# Patient Record
Sex: Male | Born: 1935 | Race: White | Hispanic: No | Smoking: Never smoker
Health system: Southern US, Community
[De-identification: ages and names within clinical notes are randomized; demographics above are authoritative.]

## PROBLEM LIST (undated history)

## (undated) DIAGNOSIS — I82409 Acute embolism and thrombosis of unspecified deep veins of unspecified lower extremity: Secondary | ICD-10-CM

## (undated) DIAGNOSIS — J342 Deviated nasal septum: Secondary | ICD-10-CM

## (undated) DIAGNOSIS — J9691 Respiratory failure, unspecified with hypoxia: Secondary | ICD-10-CM

## (undated) DIAGNOSIS — J181 Lobar pneumonia, unspecified organism: Secondary | ICD-10-CM

## (undated) DIAGNOSIS — I2699 Other pulmonary embolism without acute cor pulmonale: Secondary | ICD-10-CM

## (undated) DIAGNOSIS — N400 Enlarged prostate without lower urinary tract symptoms: Secondary | ICD-10-CM

## (undated) DIAGNOSIS — J189 Pneumonia, unspecified organism: Secondary | ICD-10-CM

## (undated) HISTORY — DX: Benign prostatic hyperplasia without lower urinary tract symptoms: N40.0

## (undated) HISTORY — DX: Respiratory failure, unspecified with hypoxia: J96.91

## (undated) HISTORY — DX: Pneumonia, unspecified organism: J18.9

## (undated) HISTORY — DX: Lobar pneumonia, unspecified organism: J18.1

## (undated) HISTORY — DX: Other pulmonary embolism without acute cor pulmonale: I26.99

## (undated) HISTORY — PX: EYE SURGERY: SHX253

---

## 2012-08-09 ENCOUNTER — Ambulatory Visit (INDEPENDENT_AMBULATORY_CARE_PROVIDER_SITE_OTHER): Payer: Self-pay | Admitting: Otolaryngology

## 2012-08-09 ENCOUNTER — Ambulatory Visit (INDEPENDENT_AMBULATORY_CARE_PROVIDER_SITE_OTHER): Payer: Medicare Other | Admitting: Otolaryngology

## 2012-08-09 DIAGNOSIS — H9209 Otalgia, unspecified ear: Secondary | ICD-10-CM

## 2012-08-09 DIAGNOSIS — J343 Hypertrophy of nasal turbinates: Secondary | ICD-10-CM

## 2012-08-09 DIAGNOSIS — J342 Deviated nasal septum: Secondary | ICD-10-CM

## 2014-05-30 HISTORY — PX: OTHER SURGICAL HISTORY: SHX169

## 2014-07-01 ENCOUNTER — Encounter: Payer: Self-pay | Admitting: Critical Care Medicine

## 2014-07-02 ENCOUNTER — Encounter: Payer: Self-pay | Admitting: Critical Care Medicine

## 2014-07-02 ENCOUNTER — Ambulatory Visit (INDEPENDENT_AMBULATORY_CARE_PROVIDER_SITE_OTHER): Payer: Medicare Other | Admitting: Critical Care Medicine

## 2014-07-02 ENCOUNTER — Ambulatory Visit (INDEPENDENT_AMBULATORY_CARE_PROVIDER_SITE_OTHER)
Admission: RE | Admit: 2014-07-02 | Discharge: 2014-07-02 | Disposition: A | Discharge status: Home / Self Care | Payer: Medicare Other | Source: Ambulatory Visit | Attending: Critical Care Medicine | Admitting: Critical Care Medicine

## 2014-07-02 ENCOUNTER — Other Ambulatory Visit (INDEPENDENT_AMBULATORY_CARE_PROVIDER_SITE_OTHER): Payer: Medicare Other

## 2014-07-02 VITALS — BP 116/58 | HR 115 | Temp 98.5°F | Ht 71.0 in | Wt 184.0 lb

## 2014-07-02 DIAGNOSIS — I82403 Acute embolism and thrombosis of unspecified deep veins of lower extremity, bilateral: Secondary | ICD-10-CM

## 2014-07-02 DIAGNOSIS — I82409 Acute embolism and thrombosis of unspecified deep veins of unspecified lower extremity: Secondary | ICD-10-CM | POA: Insufficient documentation

## 2014-07-02 DIAGNOSIS — J9691 Respiratory failure, unspecified with hypoxia: Secondary | ICD-10-CM | POA: Insufficient documentation

## 2014-07-02 DIAGNOSIS — Z86711 Personal history of pulmonary embolism: Secondary | ICD-10-CM | POA: Insufficient documentation

## 2014-07-02 DIAGNOSIS — J189 Pneumonia, unspecified organism: Secondary | ICD-10-CM

## 2014-07-02 DIAGNOSIS — R3914 Feeling of incomplete bladder emptying: Secondary | ICD-10-CM

## 2014-07-02 DIAGNOSIS — N400 Enlarged prostate without lower urinary tract symptoms: Secondary | ICD-10-CM

## 2014-07-02 DIAGNOSIS — Z8719 Personal history of other diseases of the digestive system: Secondary | ICD-10-CM | POA: Insufficient documentation

## 2014-07-02 DIAGNOSIS — I2699 Other pulmonary embolism without acute cor pulmonale: Secondary | ICD-10-CM

## 2014-07-02 DIAGNOSIS — Z9889 Other specified postprocedural states: Secondary | ICD-10-CM

## 2014-07-02 DIAGNOSIS — J9611 Chronic respiratory failure with hypoxia: Secondary | ICD-10-CM

## 2014-07-02 DIAGNOSIS — N401 Enlarged prostate with lower urinary tract symptoms: Secondary | ICD-10-CM

## 2014-07-02 DIAGNOSIS — J181 Lobar pneumonia, unspecified organism: Secondary | ICD-10-CM

## 2014-07-02 LAB — CBC WITH DIFFERENTIAL/PLATELET
Basophils Absolute: 0 10*3/uL (ref 0.0–0.1)
Basophils Relative: 0.6 % (ref 0.0–3.0)
EOS PCT: 2.2 % (ref 0.0–5.0)
Eosinophils Absolute: 0.2 10*3/uL (ref 0.0–0.7)
HCT: 45.2 % (ref 39.0–52.0)
Hemoglobin: 15.3 g/dL (ref 13.0–17.0)
LYMPHS ABS: 0.9 10*3/uL (ref 0.7–4.0)
Lymphocytes Relative: 11.9 % — ABNORMAL LOW (ref 12.0–46.0)
MCHC: 33.9 g/dL (ref 30.0–36.0)
MCV: 94.6 fl (ref 78.0–100.0)
MONOS PCT: 8 % (ref 3.0–12.0)
Monocytes Absolute: 0.6 10*3/uL (ref 0.1–1.0)
NEUTROS ABS: 5.8 10*3/uL (ref 1.4–7.7)
Neutrophils Relative %: 77.3 % — ABNORMAL HIGH (ref 43.0–77.0)
Platelets: 378 10*3/uL (ref 150.0–400.0)
RBC: 4.78 Mil/uL (ref 4.22–5.81)
RDW: 13.4 % (ref 11.5–15.5)
WBC: 7.6 10*3/uL (ref 4.0–10.5)

## 2014-07-02 LAB — COMPREHENSIVE METABOLIC PANEL
ALBUMIN: 3.7 g/dL (ref 3.5–5.2)
ALK PHOS: 108 U/L (ref 39–117)
ALT: 20 U/L (ref 0–53)
AST: 15 U/L (ref 0–37)
BUN: 15 mg/dL (ref 6–23)
CALCIUM: 9.8 mg/dL (ref 8.4–10.5)
CHLORIDE: 102 meq/L (ref 96–112)
CO2: 27 meq/L (ref 19–32)
Creatinine, Ser: 0.85 mg/dL (ref 0.40–1.50)
GFR: 92.81 mL/min (ref >60.00)
GLUCOSE: 91 mg/dL (ref 70–99)
POTASSIUM: 4.6 meq/L (ref 3.5–5.1)
SODIUM: 136 meq/L (ref 135–145)
TOTAL PROTEIN: 7.2 g/dL (ref 6.0–8.3)
Total Bilirubin: 0.5 mg/dL (ref 0.2–1.2)

## 2014-07-02 IMAGING — CR DG CHEST 2V
2 series · 2 of 2 positions shown · non-contrast
Comparison: 06/10/2014.

CLINICAL DATA: Pneumonia.

EXAM:
CHEST  2 VIEW

[view not recorded (1 of 2)]
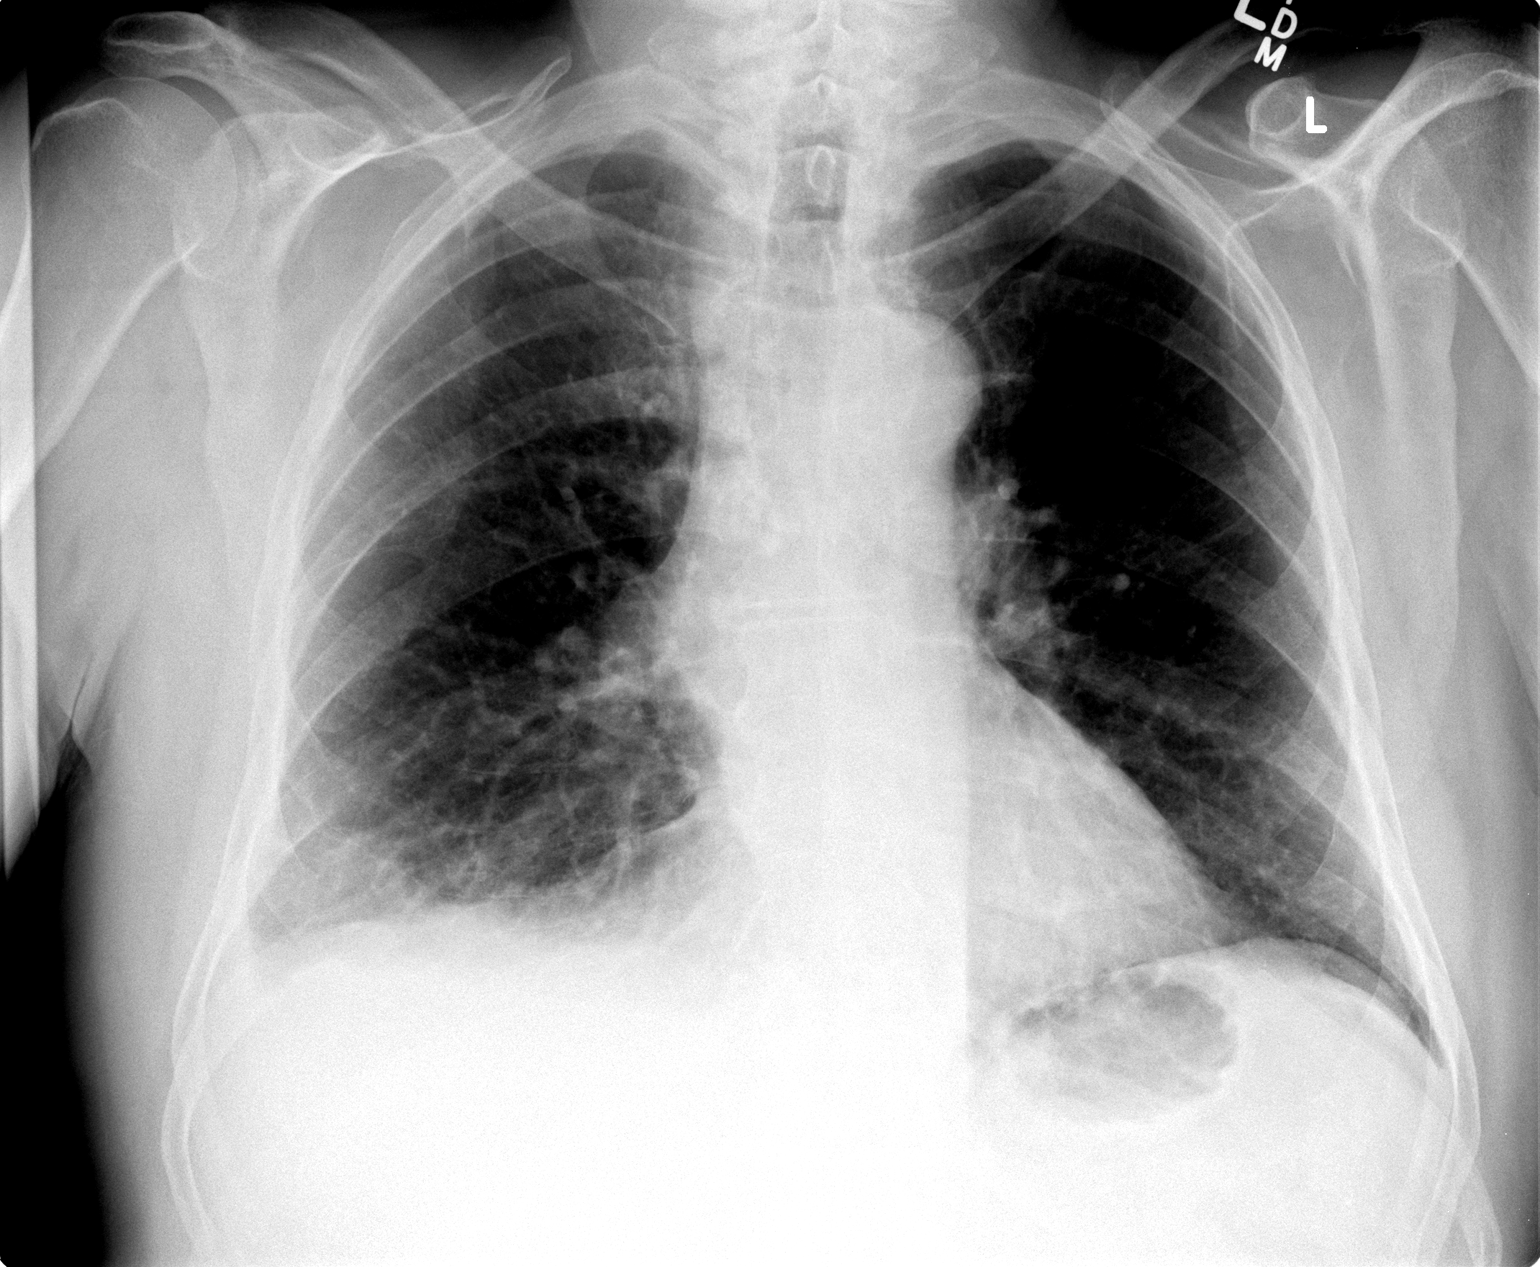

[view not recorded (2 of 2)]
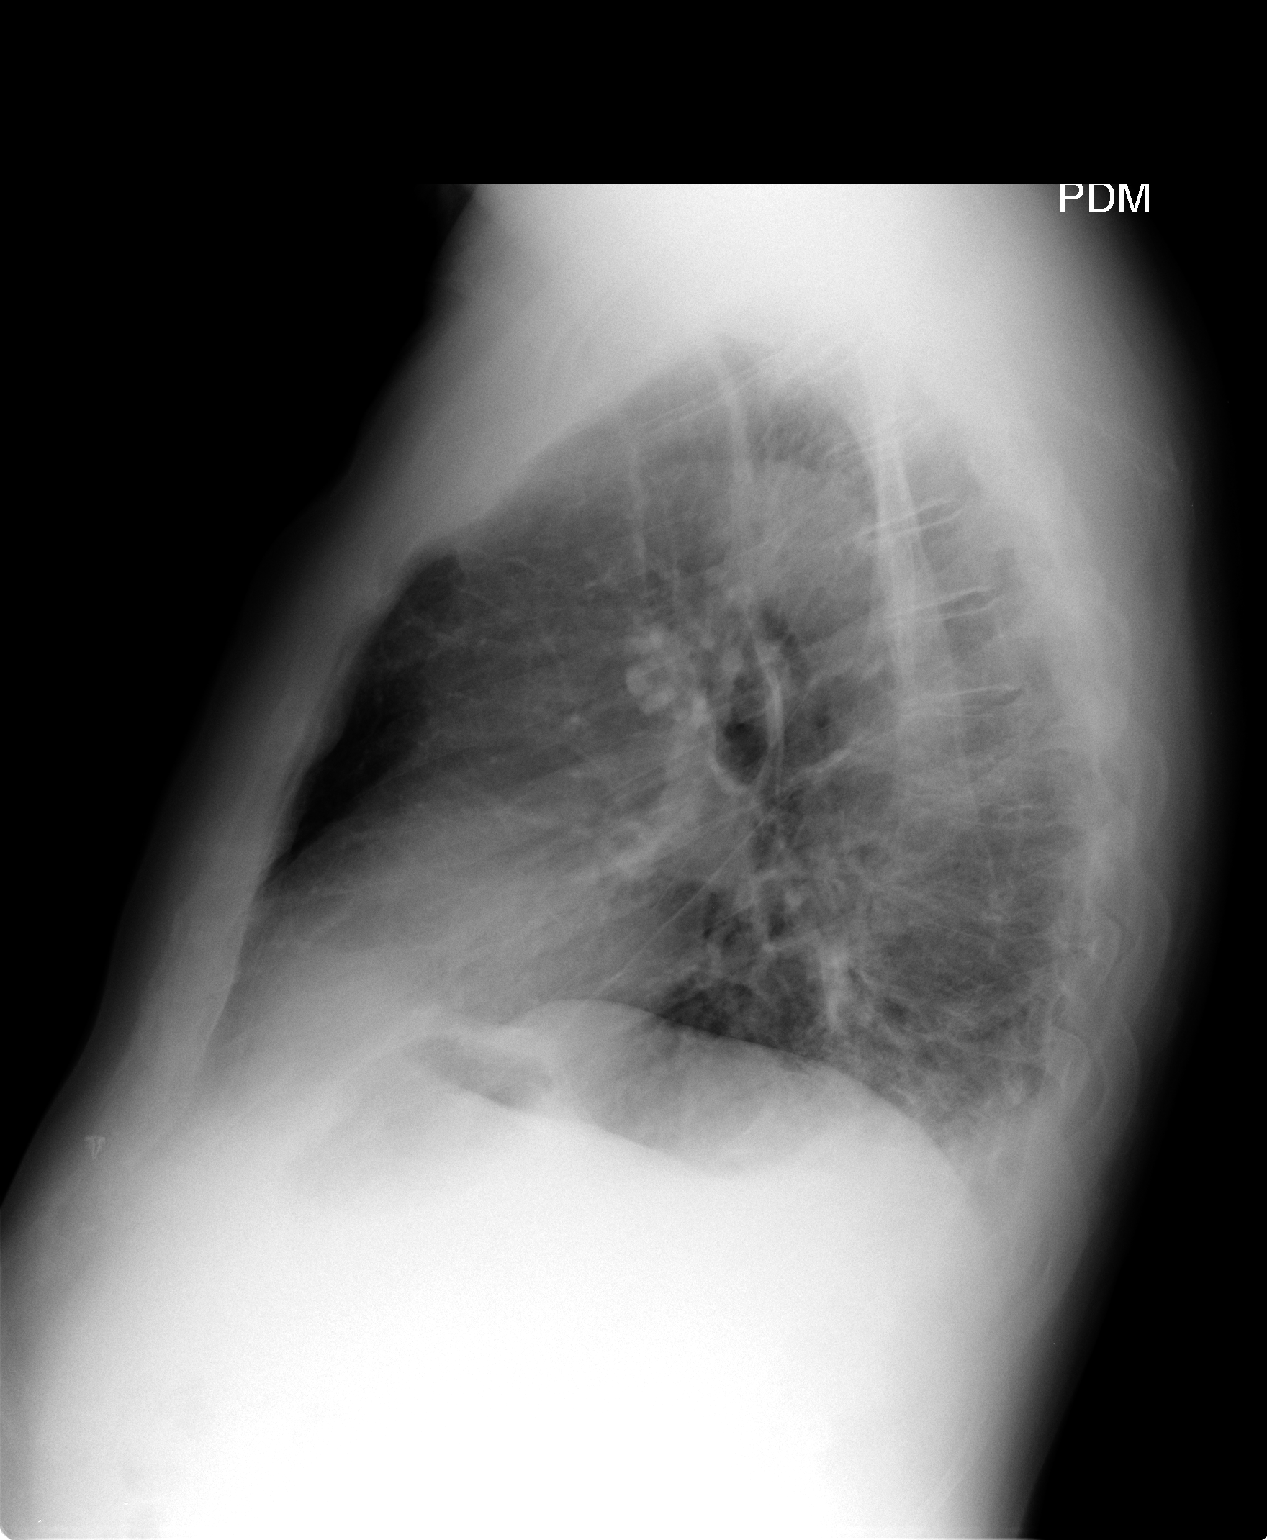

[2 of 2 positions shown; findings below may reference images not displayed]

FINDINGS: Mediastinum and hilar structures normal. Heart size normal. Right
lower lobe infiltrate consistent with pneumonia. Small right pleural
effusion. No acute bony abnormality.
IMPRESSION: Right lower lobe mild infiltrate consistent with pneumonia.
Associated small right pleural effusion.

## 2014-07-02 NOTE — Progress Notes (Signed)
Subjective:    Patient ID: Richard Cannon, male    DOB: 28-Feb-1935, 80 y.o.   MRN: [id number]  HPI Comments: Hx of PE/VTE/DVT.  Pt had hernia surgery 05/30/14.   No prior hx of blood clots. Pt had episode of UTI/BPH after hernia surgery. [facility].  WHile in hosp had injections, none on d/c.  After d/c to home, issues with urination, then back to ED, placed a catheter and sent home. During this time had lots of mucus, emesis, dry heaves, poss aspiration. 1/16 back to ED dx PNA, resp failure, and PE DVT. Rx eliquis and Lovenox .  Now needs BPH surgery.  Pt has urology MD in [place].  Pt desires to transfer to [facility] for medical care . D/c 06/15/14: since d/c is better, started oxygen 2L 24/7: DME [facility].  Now no cough. No hematuria. Off lovenox now.  Now on eliquis  bid after 7 days bid   Shortness of Breath This is a new problem. The current episode started 1 to 4 weeks ago. The problem occurs daily. The problem has been rapidly improving. Pertinent negatives include no abdominal pain, chest pain, ear pain, fever, headaches, hemoptysis, leg pain, leg swelling, neck pain, orthopnea, PND, rash, rhinorrhea, sore throat, sputum production, swollen glands, syncope, vomiting or wheezing. Nothing aggravates the symptoms. His past medical history is significant for DVT, PE, pneumonia and a recent surgery. There is no history of allergies, asthma, bronchiolitis, CAD, chronic lung disease, COPD or a heart failure.   Past Medical History  Diagnosis Date  . Acute pulmonary embolism   . Pneumonia of lower lobe of lung     bilateral  . Respiratory failure with hypoxia   . Benign prostatic hypertrophy      Family History  Problem Relation Age of Onset  . Heart disease Mother   . Breast cancer Mother   . Skin cancer Brother      History   Social History  . Marital Status: Married    Spouse Name: N/A  . Number of Children: N/A  . Years of Education: N/A   Occupational History  . Retired    Financial controller   Social History Main Topics  . Smoking status: Never Smoker   . Smokeless tobacco: Never Used  . Alcohol Use: No  . Drug Use: No  . Sexual Activity: Not on file   Other Topics Concern  . Not on file   Social History Narrative     Allergies  Allergen Reactions  . Iodine   . Tetracyclines & Related     Hospitalized after taking - unsure of reaction     No outpatient prescriptions prior to visit.   No facility-administered medications prior to visit.       Review of Systems  Constitutional: Positive for chills, fatigue and unexpected weight change. Negative for fever, diaphoresis, activity change and appetite change.  HENT: Positive for voice change. Negative for congestion, dental problem, ear discharge, ear pain, facial swelling, hearing loss, mouth sores, nosebleeds, postnasal drip, rhinorrhea, sinus pressure, sneezing, sore throat, tinnitus and trouble swallowing.   Eyes: Negative for photophobia, discharge, itching and visual disturbance.  Respiratory: Positive for cough and shortness of breath. Negative for apnea, hemoptysis, sputum production, choking, chest tightness, wheezing and stridor.   Cardiovascular: Negative for chest pain, palpitations, orthopnea, leg swelling, syncope and PND.  Gastrointestinal: Positive for constipation. Negative for nausea, vomiting, abdominal pain, blood in stool and abdominal distention.       Occ gerd  Genitourinary: Negative for dysuria, urgency, frequency, hematuria, flank pain, decreased urine volume and difficulty urinating.  Musculoskeletal: Positive for gait problem. Negative for myalgias, back pain, joint swelling, arthralgias, neck pain and neck stiffness.  Skin: Negative for color change, pallor and rash.  Neurological: Positive for dizziness and weakness. Negative for tremors, seizures, syncope, speech difficulty, light-headedness, numbness and headaches.  Hematological: Negative for adenopathy. Does not  bruise/bleed easily.  Psychiatric/Behavioral: Negative for confusion, sleep disturbance and agitation. The patient is not nervous/anxious.        Objective:   Physical Exam Filed Vitals:   07/02/14 0853  BP: 116/58  Pulse: 115  Temp: 98.5 F (36.9 C)  TempSrc: Oral  Height: 5\' 11"  (1.803 m)  Weight: 184 lb (83.462 kg)  SpO2: 97%    Gen: Pleasant, well-nourished, in no distress,  normal affect  ENT: No lesions,  mouth clear,  oropharynx clear, no postnasal drip  Neck: No JVD, no TMG, no carotid bruits  Lungs: No use of accessory muscles, no dullness to percussion, clear without rales or rhonchi  Cardiovascular: RRR, heart sounds normal, no murmur or gallops, no peripheral edema  Abdomen: soft and NT, no HSM,  BS normal  Musculoskeletal: No deformities, no cyanosis or clubbing  Neuro: alert, non focal  Skin: Warm, no lesions or rashes  No results found.  [facility] Echocardiogram was normal       Assessment & Plan:   Acute pulmonary embolism History of massive saddle pulmonary embolism with bilateral deep venous thrombosis and significant clot burden January 2016 Associated aspiration pneumonia with acute hypoxic respiratory failure No hypercoagulable studies have yet been obtained Suspicion is this is a provoked event in a male with previous right inguinal hernia surgery and likely insufficient perioperative thrombo-prophylaxis Plan Would suggest minimum 3 months of therapy with Eliquis to continue at 5 mg twice daily. Note patient had already completed a week's worth of Eliquis 10 mg twice daily. 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 and chronic indwelling Foley catheter placement from January 2016 The patient desires a referral to [facility] as he is wishing to [place] due to local family being in this area Plan [facility] Dr Crecencio McLes Borden  for evaluation of prosthetic hypertrophy Note this patient should remain on Eliquis for at least 2 more weeks before any plans to consider surgery on the prostate is entertained and the patient will need bridging with Lovenox should surgical intervention be entertained   Pneumonia of lower lobe of lung Bilateral aspiration pneumonia which has now resolved Plan Obtain chest x-ray   DVT (deep venous thrombosis) History of bilateral deep venous thrombosis now on anticoagulant therapy with improvement in symptom complex Note this was provoked    Note this patient does not have a diagnosis of hypertension and is on at least 2 different antihypertensive medications. Likely this patient was quite hypertensive due to significant pulmonary emboli. Also because of urinary tract obstruction. Note patient's edema has improved and now the patient is volume depleted.  Plan Discontinue potassium, lisinopril, albuterol, Norvasc.  I had an extended discussion with the patient  reviewing all relevant studies completed to date and  lasting 15 to 20 minutes of a 25 minute visit on the following ongoing concerns:     Discussion included coordination of care, disease process, medication management, and change in plan  Updated Medication List Outpatient Encounter Prescriptions as of 07/02/2014  Medication Sig  . acetaminophen (TYLENOL) 325 MG tablet Take 325-650 mg by mouth every 4 (four) hours as needed.  . ciprofloxacin (CIPRO) 500 MG tablet Take 1 tablet by mouth 2 (two) times daily. Will take last dose on 07/02/14  . ELIQUIS 5 MG TABS tablet Take 1 tablet by mouth 2  (two) times daily.  . finasteride (PROSCAR) 5 MG tablet Take 1 tablet by mouth daily.  Marland Kitchen HYDROcodone-acetaminophen (NORCO) 7.5-325 MG per tablet as needed.  . nystatin (MYCOSTATIN) 100000 UNIT/ML suspension as needed.  . tamsulosin (FLOMAX) 0.4 MG CAPS capsule Take 0.4 mg by mouth 2 (two) times daily.  . [DISCONTINUED] albuterol (PROVENTIL) (2.5 MG/3ML) 0.083% nebulizer solution Take 2.5 mg by nebulization 2 (two) times daily.  . [DISCONTINUED] amLODipine (NORVASC) 10 MG tablet Take 10 mg by mouth daily.  . [DISCONTINUED] KLOR-CON M20 20 MEQ tablet Take 20 mEq by mouth daily.  . [DISCONTINUED] lisinopril (PRINIVIL,ZESTRIL) 10 MG tablet Take 10 mg by mouth daily.

## 2014-07-02 NOTE — Assessment & Plan Note (Signed)
Bilateral aspiration pneumonia which has now resolved Plan Obtain chest x-ray

## 2014-07-02 NOTE — Assessment & Plan Note (Signed)
History of massive saddle pulmonary embolism with bilateral deep venous thrombosis and significant clot burden January 2016 Associated aspiration pneumonia with acute hypoxic respiratory failure No hypercoagulable studies have yet been obtained Suspicion is this is a provoked event in a male with previous right inguinal hernia surgery and likely insufficient perioperative thrombo-prophylaxis Plan Would suggest minimum 3 months of therapy with Eliquis to continue at 5 mg twice daily. Note patient had already completed a week's worth of Eliquis 10 mg twice daily. Obtain hypercoagulable profile with subset of factor V Leiden, prothrombin gene mutation, and lupus anticoagulant Follow-up Cmet and CBC Wean oxygen now to off as the hypoxemia has improved

## 2014-07-02 NOTE — Assessment & Plan Note (Signed)
History of bilateral deep venous thrombosis now on anticoagulant therapy with improvement in symptom complex Note this was provoked

## 2014-07-02 NOTE — Assessment & Plan Note (Signed)
Hypoxemia now resolved Discontinue oxygen and orders will be sent

## 2014-07-02 NOTE — Assessment & Plan Note (Addendum)
Severe prosthetic hypertrophy with chronic outflow tract obstruction and chronic indwelling Foley catheter placement from January 2016 The patient desires a referral to a [facility] as he is wishing to [place] [facility] [place] due to local family being in this area Plan [facility] Dr Crecencio McLes Cannon  for evaluation of prosthetic hypertrophy Note this patient should remain on Eliquis for at least 2 more weeks before any plans to consider surgery on the prostate is entertained and the patient will need bridging with Lovenox should surgical intervention be entertained

## 2014-07-02 NOTE — Patient Instructions (Addendum)
Stop lisinpril, potassium, amlodipine and albuterol Stay on eliquis A Chest xray was obtained Labs today You may STOP oxygen A referral to primary care Sanda Linger([facility]) , urology(les borden)  was made Return 1 month

## 2014-07-03 LAB — CARDIOLIPIN ANTIBODIES, IGG, IGM, IGA
Anticardiolipin IgA: 10 APL U/mL (ref <22)
Anticardiolipin IgG: 8 GPL U/mL (ref <23)
Anticardiolipin IgM: 0 MPL U/mL (ref <11)

## 2014-07-03 LAB — LUPUS ANTICOAGULANT PANEL
DRVVT 1:1 Mix: 38.9 secs (ref <42.9)
DRVVT: 46.4 s — AB (ref <42.9)
Lupus Anticoagulant: DETECTED — AB
PTT Lupus Anticoagulant: 46.6 secs — ABNORMAL HIGH (ref 28.0–43.0)
PTTLA 4:1 Mix: 48 secs — ABNORMAL HIGH (ref 28.0–43.0)
PTTLA Confirmation: 10.6 secs — ABNORMAL HIGH (ref <8.0)

## 2014-07-03 NOTE — Progress Notes (Signed)
Quick Note:  Spoke with pt. Discussed cxr results per Dr. Wright. He verbalized understanding and voiced no further questions or concerns at this time. ______ 

## 2014-07-03 NOTE — Progress Notes (Signed)
Quick Note:  Spoke with pt. Discussed lab results per PW. He verbalized understanding and voiced no further questions or concerns at this time. ______

## 2014-07-06 LAB — FACTOR 5 LEIDEN

## 2014-07-06 LAB — PROTHROMBIN GENE MUTATION

## 2014-07-07 ENCOUNTER — Encounter: Payer: Self-pay | Admitting: Critical Care Medicine

## 2014-07-07 DIAGNOSIS — R760 Raised antibody titer: Secondary | ICD-10-CM | POA: Insufficient documentation

## 2014-07-08 MED ORDER — ELIQUIS 5 MG PO TABS: 5.0000 mg | ORAL_TABLET | Freq: Two times a day (BID) | ORAL | Status: DC

## 2014-07-28 ENCOUNTER — Ambulatory Visit (INDEPENDENT_AMBULATORY_CARE_PROVIDER_SITE_OTHER): Payer: Medicare Other | Admitting: Internal Medicine

## 2014-07-28 ENCOUNTER — Encounter: Payer: Self-pay | Admitting: Internal Medicine

## 2014-07-28 VITALS — BP 118/62 | HR 85 | Temp 98.2°F | Resp 18 | Ht 71.0 in | Wt 187.0 lb

## 2014-07-28 DIAGNOSIS — I2699 Other pulmonary embolism without acute cor pulmonale: Secondary | ICD-10-CM

## 2014-07-28 DIAGNOSIS — R760 Raised antibody titer: Secondary | ICD-10-CM

## 2014-07-28 DIAGNOSIS — N400 Enlarged prostate without lower urinary tract symptoms: Secondary | ICD-10-CM

## 2014-07-28 DIAGNOSIS — R790 Abnormal level of blood mineral: Secondary | ICD-10-CM

## 2014-07-28 NOTE — Patient Instructions (Signed)
We do not need to check any blood work today. We will recheck blood work in about 5 months to see about the lupus anticoagulant if you have it.   Keep taking the flomax (tamsulosin) once a day. No other medicines have changed.   Please come back in about 4-5 months so we can recheck the blood tests and talk about whether you should continue on the blood thinner.

## 2014-07-28 NOTE — Assessment & Plan Note (Addendum)
Would recommend 6 months of therapy with eliquis then retesting lupus anticoagulant (see above) and decide risk versus benefit with the patient regarding ongoing anticoagulation. This was a provoked PE. There is family history of blood clots (in his father). If he needs urologic procedure before then would recommend bridging with lovenox injections and no going off anticoagulation. Off oxygen and no pain with breathing. Back to his normal activities.

## 2014-07-28 NOTE — Progress Notes (Signed)
   Subjective:    Patient ID: Richard Cannon, male    DOB: 07-Apr-1935, 80 y.o.   MRN: [id number]  HPI The patient is a 80 YO man who is coming in as a new patient for DVT. He was hospitalized in January for an inguinal hernia repair which was done as an out-patient. He then had urinary retention for 2 days and ended up back in the hospital and had DVT and massive PE. He is now taking eliquis for the blood clot and feeling significantly better. His only other medical problem is BPH and he is seeing urologist who may need to do procedure on him in the near future.   [facility], social hisotry reviewed and updated at today's visit.   Review of Systems  Constitutional: Negative for fever, activity change, appetite change, fatigue and unexpected weight change.  HENT: Negative.   Eyes: Negative.   Respiratory: Negative for cough, chest tightness, shortness of breath and wheezing.   Cardiovascular: Negative for chest pain, palpitations and leg swelling.  Gastrointestinal: Negative for nausea, abdominal pain, diarrhea and abdominal distention.  Genitourinary: Positive for difficulty urinating.  Musculoskeletal: Positive for arthralgias. Negative for myalgias and gait problem.  Skin: Negative.   Neurological: Positive for light-headedness. Negative for dizziness, syncope, weakness and headaches.       With taking 2 flomax a day      Objective:   Physical Exam  Constitutional: He is oriented to person, place, and time. He appears well-developed and well-nourished.  HENT:  Head: Normocephalic and atraumatic.  Eyes: EOM are normal.  Neck: Normal range of motion.  Cardiovascular: Normal rate and regular rhythm.   Pulmonary/Chest: Effort normal and breath sounds normal. No respiratory distress. He has no wheezes.  Abdominal: Soft. Bowel sounds are normal. He exhibits no distension. There is no tenderness. There is no rebound.  Musculoskeletal: He exhibits no edema.  Neurological: He is alert and  oriented to person, place, and time.  Skin: Skin is warm and dry.  Psychiatric: He has a normal mood and affect.   Filed Vitals:   07/28/14 1009  BP: 118/62  Pulse: 85  Temp: 98.2 F (36.8 C)  TempSrc: Oral  Resp: 18  Height: 5\' 11"  (1.803 m)  Weight: 187 lb (84.823 kg)  SpO2: 97%      Assessment & Plan:

## 2014-07-28 NOTE — Assessment & Plan Note (Signed)
Recommend to continue flomax 1 pill daily (2 pills daily was making him lightheaded). He will continue on finasteride and follow up with his urologist later today. Has an appointment with urology in town in May. Has foley at this time.

## 2014-07-28 NOTE — Assessment & Plan Note (Signed)
Cannot diagnose with lupus anticoagulant until 2 positive at least 6 months apart. Will recheck in about 5 months and if positive again could consider continuing anticoagulation since his risk of complications from it (bleeding) are relatively low. If repeat negative would likely stop but consider anticoagulation around any future surgeries.

## 2014-07-28 NOTE — Progress Notes (Signed)
Pre visit review using our clinic review tool, if applicable. No additional management support is needed unless otherwise documented below in the visit note. 

## 2014-08-04 ENCOUNTER — Encounter: Payer: Self-pay | Admitting: Critical Care Medicine

## 2014-08-04 ENCOUNTER — Ambulatory Visit (INDEPENDENT_AMBULATORY_CARE_PROVIDER_SITE_OTHER): Payer: Medicare Other | Admitting: Critical Care Medicine

## 2014-08-04 ENCOUNTER — Ambulatory Visit (HOSPITAL_COMMUNITY)
Admission: RE | Admit: 2014-08-04 | Discharge: 2014-08-04 | Disposition: A | Discharge status: Home / Self Care | Payer: Medicare Other | Source: Ambulatory Visit | Attending: Critical Care Medicine | Admitting: Critical Care Medicine

## 2014-08-04 VITALS — BP 122/70 | HR 63 | Temp 97.1°F | Ht 71.0 in | Wt 186.6 lb

## 2014-08-04 DIAGNOSIS — I2699 Other pulmonary embolism without acute cor pulmonale: Secondary | ICD-10-CM

## 2014-08-04 DIAGNOSIS — I8290 Acute embolism and thrombosis of unspecified vein: Secondary | ICD-10-CM

## 2014-08-04 DIAGNOSIS — I82403 Acute embolism and thrombosis of unspecified deep veins of lower extremity, bilateral: Secondary | ICD-10-CM

## 2014-08-04 NOTE — Progress Notes (Signed)
*  PRELIMINARY RESULTS* Vascular Ultrasound Lower extremity venous duplex has been completed.  Preliminary findings: No evidence of DVT bilaterally. Subacute superficial thrombosis is noted in the right mid greater saphenous vein.   Called results to Dr. Delford FieldWright.  Farrel DemarkJill Eunice, RDMS, RVT  08/04/2014, 1:54 PM

## 2014-08-04 NOTE — Patient Instructions (Signed)
Stay on eliquis, get refilled for third month of treatment Get venous doppler ultrasound obtained today Return 6 weeks.

## 2014-08-04 NOTE — Progress Notes (Signed)
Subjective:    Patient ID: Richard Cannon, male    DOB: 15-Apr-1935, 80 y.o.   MRN: [id number]  HPI Comments: Hx of PE/VTE/DVT.  Pt had hernia surgery 05/30/14.   No prior hx of blood clots. Pt had episode of UTI/BPH after hernia surgery. [facility].  WHile in hosp had injections, none on d/c.  After d/c to home, issues with urination, then back to ED, placed a catheter and sent home. During this time had lots of mucus, emesis, dry heaves, poss aspiration. 1/16 back to ED dx PNA, resp failure, and PE DVT. Rx eliquis and Lovenox .  Now needs BPH surgery.  Pt has urology MD in [place].  Pt desires to transfer to [facility] for medical care . D/c 06/15/14: since d/c is better, started oxygen 2L 24/7: [facility].  Now no cough. No hematuria. Off lovenox now.  Now on eliquis  bid after 7 days bid   Shortness of Breath This is a new problem. The current episode started 1 to 4 weeks ago. The problem occurs daily. The problem has been rapidly improving. Pertinent negatives include no abdominal pain, chest pain, ear pain, fever, headaches, hemoptysis, leg pain, leg swelling, neck pain, orthopnea, PND, rash, rhinorrhea, sore throat, sputum production, swollen glands, syncope, vomiting or wheezing. Nothing aggravates the symptoms. His past medical history is significant for DVT, PE, pneumonia and a recent surgery. There is no history of allergies, asthma, bronchiolitis, CAD, chronic lung disease, COPD or a heart failure.   08/04/2014 Chief Complaint  Patient presents with  . Follow-up    Patient is doing well.  No concerns.  No cough or chest pain.  No edema in legs   Catheter is now out . BPH only  Overall doing well. Now off oxygen and all anti htn meds. Pt denies any significant sore throat, nasal congestion or excess secretions, fever, chills, sweats, unintended weight loss, pleurtic or exertional chest pain, orthopnea PND, or leg swelling Pt denies any increase in rescue therapy over baseline, denies waking up  needing it or having any early am or nocturnal exacerbations of coughing/wheezing/or dyspnea. Pt also denies any obvious fluctuation in symptoms with  weather or environmental change or other alleviating or aggravating factors    Past Medical History  Diagnosis Date  . Acute pulmonary embolism   . Pneumonia of lower lobe of lung     bilateral  . Respiratory failure with hypoxia   . Benign prostatic hypertrophy      Family History  Problem Relation Age of Onset  . Heart disease Mother   . Breast cancer Mother   . Skin cancer Brother      History   Social History  . Marital Status: Married    Spouse Name: N/A  . Number of Children: N/A  . Years of Education: N/A   Occupational History  . Retired     Financial controller   Social History Main Topics  . Smoking status: Never Smoker   . Smokeless tobacco: Never Used  . Alcohol Use: No  . Drug Use: No  . Sexual Activity: Not on file   Other Topics Concern  . Not on file   Social History Narrative     Allergies  Allergen Reactions  . Iodine   . Tetracyclines & Related     Hospitalized after taking - unsure of reaction     Outpatient Prescriptions Prior to Visit  Medication Sig Dispense Refill  . ELIQUIS 5 MG TABS tablet Take  1 tablet (5 mg total) by mouth 2 (two) times daily. 60 tablet 1  . finasteride (PROSCAR) 5 MG tablet Take 1 tablet by mouth daily.  2  . tamsulosin (FLOMAX) 0.4 MG CAPS capsule Take 0.4 mg by mouth 2 (two) times daily. PATIENT TAKES ONE IN THE MORNING AND ONE AT NIGHT ONE DAY AND TAKES ONE AT NIGHT THE NEXT DAY AND ALTERNATES  1   No facility-administered medications prior to visit.       Review of Systems  Constitutional: Negative for fever, chills, diaphoresis, activity change, appetite change, fatigue and unexpected weight change.  HENT: Negative for congestion, dental problem, ear discharge, ear pain, facial swelling, hearing loss, mouth sores, nosebleeds, postnasal drip, rhinorrhea,  sinus pressure, sneezing, sore throat, tinnitus, trouble swallowing and voice change.   Eyes: Negative for photophobia, discharge, itching and visual disturbance.  Respiratory: Negative for apnea, cough, hemoptysis, sputum production, choking, chest tightness, shortness of breath, wheezing and stridor.   Cardiovascular: Negative for chest pain, palpitations, orthopnea, leg swelling, syncope and PND.  Gastrointestinal: Positive for constipation. Negative for nausea, vomiting, abdominal pain, blood in stool and abdominal distention.       Occ gerd  Genitourinary: Negative for dysuria, urgency, frequency, hematuria, flank pain, decreased urine volume and difficulty urinating.  Musculoskeletal: Negative for myalgias, back pain, joint swelling, arthralgias, gait problem, neck pain and neck stiffness.  Skin: Negative for color change, pallor and rash.  Neurological: Positive for dizziness. Negative for tremors, seizures, syncope, speech difficulty, weakness, light-headedness, numbness and headaches.  Hematological: Negative for adenopathy. Does not bruise/bleed easily.  Psychiatric/Behavioral: Negative for confusion, sleep disturbance and agitation. The patient is not nervous/anxious.        Objective:   Physical Exam Filed Vitals:   08/04/14 1038  BP: 122/70  Pulse: 63  Temp: 97.1 F (36.2 C)  TempSrc: Oral  Height: 5\' 11"  (1.803 m)  Weight: 186 lb 9.6 oz (84.641 kg)  SpO2: 99%    Gen: Pleasant, well-nourished, in no distress,  normal affect  ENT: No lesions,  mouth clear,  oropharynx clear, no postnasal drip  Neck: No JVD, no TMG, no carotid bruits  Lungs: No use of accessory muscles, no dullness to percussion, clear without rales or rhonchi  Cardiovascular: RRR, heart sounds normal, no murmur or gallops, no peripheral edema  Abdomen: soft and NT, no HSM,  BS normal  Musculoskeletal: No deformities, no cyanosis or clubbing  Neuro: alert, non focal  Skin: Warm, no lesions or  rashes     Echocardiogram was normal January 2016 with no pulmonary hypertension 2/10/2016Factor V Leiden>>NEG Prothombin gene mutation>>neg Lupus anticoag>>POSITIVE 08/04/14: venous doppler u/s:  NEG for DVT, complete resolution except for chronic clot in RLE superficial saphenous vein       Assessment & Plan:   Acute pulmonary embolism History of acute pulmonary embolism with associated deep venous thrombosis bilateral January 2016 that was provoked. Note patient has factor V Leiden negative prothrombin gene mutation negative it is lupus anticoagulant positive. Patient will complete 3 months of therapy mid April 2016. Note venous Doppler ultrasound on this visit was negative for any acute venous thrombosis and specifically showed resolution of bilateral deep venous thrombotic disease. Plan Complete 3 months of therapy and then would stop anticoagulation with obtaining d-dimer 3 weeks after stopping anticoagulation to assess further need for anticoagulation Return in 6 weeks   Updated Medication List Outpatient Encounter Prescriptions as of 08/04/2014  Medication Sig  . ELIQUIS 5 MG TABS tablet  Take 1 tablet (5 mg total) by mouth 2 (two) times daily.  . finasteride (PROSCAR) 5 MG tablet Take 1 tablet by mouth daily.  . tamsulosin (FLOMAX) 0.4 MG CAPS capsule Take 0.4 mg by mouth 2 (two) times daily. PATIENT TAKES ONE IN THE MORNING AND ONE AT NIGHT ONE DAY AND TAKES ONE AT NIGHT THE NEXT DAY AND ALTERNATES

## 2014-08-05 NOTE — Assessment & Plan Note (Signed)
History of acute pulmonary embolism with associated deep venous thrombosis bilateral January 2016 that was provoked. Note patient has factor V Leiden negative prothrombin gene mutation negative it is lupus anticoagulant positive. Patient will complete 3 months of therapy mid April 2016. Note venous Doppler ultrasound on this visit was negative for any acute venous thrombosis and specifically showed resolution of bilateral deep venous thrombotic disease. Plan Complete 3 months of therapy and then would stop anticoagulation with obtaining d-dimer 3 weeks after stopping anticoagulation to assess further need for anticoagulation Return in 6 weeks

## 2014-09-09 ENCOUNTER — Encounter: Payer: Self-pay | Admitting: Internal Medicine

## 2014-09-09 NOTE — Telephone Encounter (Signed)
Placed call to Dr. Wendall StadeBrad Bauer office at [facility] in [place]; [phone]; Left message  on status of medical records to be sent to [facility].  Called the practice back and spoke to billing office; [facility] that they do not have any release on this patient's chart to send his medical records.  Call to Mr Richard Cannon at home number and was given his cell phone number; [phone]. Call placed to let Mr Richard Cannon and informed him that a release had not been signed. The patient stated he "told" them to send his records, but did not realize he had to sign a form. The patient will go by the Dr. Garner NashBauer's office today and take care of this.

## 2014-09-17 ENCOUNTER — Encounter: Payer: Self-pay | Admitting: Critical Care Medicine

## 2014-09-17 ENCOUNTER — Ambulatory Visit (INDEPENDENT_AMBULATORY_CARE_PROVIDER_SITE_OTHER): Payer: Medicare Other | Admitting: Critical Care Medicine

## 2014-09-17 ENCOUNTER — Other Ambulatory Visit: Payer: Medicare Other

## 2014-09-17 VITALS — BP 130/72 | HR 65 | Ht 71.0 in | Wt 191.0 lb

## 2014-09-17 DIAGNOSIS — B9562 Methicillin resistant Staphylococcus aureus infection as the cause of diseases classified elsewhere: Secondary | ICD-10-CM | POA: Diagnosis not present

## 2014-09-17 DIAGNOSIS — L0390 Cellulitis, unspecified: Secondary | ICD-10-CM | POA: Diagnosis not present

## 2014-09-17 DIAGNOSIS — Z86711 Personal history of pulmonary embolism: Secondary | ICD-10-CM

## 2014-09-17 DIAGNOSIS — I8290 Acute embolism and thrombosis of unspecified vein: Secondary | ICD-10-CM

## 2014-09-17 NOTE — Patient Instructions (Signed)
Stay off eliquis D Dimer lab will be obtained, we will call results, if positive will recommend you taking 325mg  Aspirin daily Call if chest pain or more swelling in right leg or shortness of breath Return 4 months

## 2014-09-17 NOTE — Progress Notes (Signed)
Subjective:    Patient ID: Richard Cannon, male    DOB: June 27, 1934, 80 y.o.   MRN: [id number]  HPI Comments: Hx of PE/VTE/DVT.  Pt had hernia surgery 05/30/14.   No prior hx of blood clots. Pt had episode of UTI/BPH after hernia surgery. [facility].  WHile in hosp had injections, none on d/c.  After d/c to home, issues with urination, then back to ED, placed a catheter and sent home. During this time had lots of mucus, emesis, dry heaves, poss aspiration. 1/16 back to ED dx PNA, resp failure, and PE DVT. Rx eliquis and Lovenox .  Now needs BPH surgery.  Pt has urology MD in [place].  Pt desires to transfer to [facility] for medical care . D/c 06/15/14: since d/c is better, started oxygen 2L 24/7: DME [facility].  Now no cough. No hematuria. Off lovenox now.  Now on eliquis  bid after 7 days bid   Shortness of Breath This is a new problem. The current episode started 1 to 4 weeks ago. The problem occurs daily. The problem has been rapidly improving. Pertinent negatives include no abdominal pain, chest pain, ear pain, fever, headaches, hemoptysis, leg pain, leg swelling, neck pain, orthopnea, PND, rash, rhinorrhea, sore throat, sputum production, swollen glands, syncope, vomiting or wheezing. Nothing aggravates the symptoms. His past medical history is significant for DVT, PE, pneumonia and a recent surgery. There is no history of allergies, asthma, bronchiolitis, CAD, chronic lung disease, COPD or a heart failure.   09/17/2014 Chief Complaint  Patient presents with  . Follow-up    embolism; "feels back to normal", no concerns today   Pt doing well.  Not much energy as before.  Pt has lost sex desire.  No real cough.  No edema.  Bladder working well.   Off oxygen and ok off this Rx Pt denies any significant sore throat, nasal congestion or excess secretions, fever, chills, sweats, unintended weight loss, pleurtic or exertional chest pain, orthopnea PND, or leg swelling Pt denies any increase in rescue therapy  over baseline, denies waking up needing it or having any early am or nocturnal exacerbations of coughing/wheezing/or dyspnea. Pt also denies any obvious fluctuation in symptoms with  weather or environmental change or other alleviating or aggravating factors     Past Medical History  Diagnosis Date  . Acute pulmonary embolism   . Pneumonia of lower lobe of lung     bilateral  . Respiratory failure with hypoxia   . Benign prostatic hypertrophy      Family History  Problem Relation Age of Onset  . Heart disease Mother   . Breast cancer Mother   . Skin cancer Brother      History   Social History  . Marital Status: Married    Spouse Name: N/A  . Number of Children: N/A  . Years of Education: N/A   Occupational History  . Retired     Financial controller   Social History Main Topics  . Smoking status: Never Smoker   . Smokeless tobacco: Never Used  . Alcohol Use: No  . Drug Use: No  . Sexual Activity: Not on file   Other Topics Concern  . Not on file   Social History Narrative     Allergies  Allergen Reactions  . Iodine   . Tetracyclines & Related     Hospitalized after taking - unsure of reaction     Outpatient Prescriptions Prior to Visit  Medication Sig Dispense Refill  .  finasteride (PROSCAR) 5 MG tablet Take 1 tablet by mouth daily.  2  . tamsulosin (FLOMAX) 0.4 MG CAPS capsule Take 0.4 mg by mouth daily. PATIENT TAKES ONE IN THE MORNING AND ONE AT NIGHT ONE DAY AND TAKES ONE AT NIGHT THE NEXT DAY AND ALTERNATES  1  . ELIQUIS 5 MG TABS tablet Take 1 tablet (5 mg total) by mouth 2 (two) times daily. (Patient not taking: Reported on 09/17/2014) 60 tablet 1   No facility-administered medications prior to visit.       Review of Systems  Constitutional: Negative for fever, chills, diaphoresis, activity change, appetite change, fatigue and unexpected weight change.  HENT: Negative for congestion, dental problem, ear discharge, ear pain, facial swelling,  hearing loss, mouth sores, nosebleeds, postnasal drip, rhinorrhea, sinus pressure, sneezing, sore throat, tinnitus, trouble swallowing and voice change.   Eyes: Negative for photophobia, discharge, itching and visual disturbance.  Respiratory: Negative for apnea, cough, hemoptysis, sputum production, choking, chest tightness, shortness of breath, wheezing and stridor.   Cardiovascular: Negative for chest pain, palpitations, orthopnea, leg swelling, syncope and PND.  Gastrointestinal: Positive for constipation. Negative for nausea, vomiting, abdominal pain, blood in stool and abdominal distention.       Occ gerd  Genitourinary: Negative for dysuria, urgency, frequency, hematuria, flank pain, decreased urine volume and difficulty urinating.  Musculoskeletal: Negative for myalgias, back pain, joint swelling, arthralgias, gait problem, neck pain and neck stiffness.  Skin: Negative for color change, pallor and rash.  Neurological: Positive for dizziness. Negative for tremors, seizures, syncope, speech difficulty, weakness, light-headedness, numbness and headaches.  Hematological: Negative for adenopathy. Does not bruise/bleed easily.  Psychiatric/Behavioral: Negative for confusion, sleep disturbance and agitation. The patient is not nervous/anxious.        Objective:   Physical Exam Filed Vitals:   09/17/14 1041  BP: 130/72  Pulse: 65  Height: 5\' 11"  (1.803 m)  Weight: 191 lb (86.637 kg)  SpO2: 98%    Gen: Pleasant, well-nourished, in no distress,  normal affect  ENT: No lesions,  mouth clear,  oropharynx clear, no postnasal drip  Neck: No JVD, no TMG, no carotid bruits  Lungs: No use of accessory muscles, no dullness to percussion, clear without rales or rhonchi  Cardiovascular: RRR, heart sounds normal, no murmur or gallops, no peripheral edema  Abdomen: soft and NT, no HSM,  BS normal  Musculoskeletal: No deformities, no cyanosis or clubbing  Neuro: alert, non focal  Skin:  Warm, no lesions or rashes     Echocardiogram was normal January 2016 with no pulmonary hypertension 2/10/2016Factor V Leiden>>NEG Prothombin gene mutation>>neg Lupus anticoag>>POSITIVE 08/04/14: venous doppler u/s:  NEG for DVT, complete resolution except for chronic clot in RLE superficial saphenous vein  D Dimer 0.53 09/17/14     Assessment & Plan:   History of pulmonary embolism HX MASSIVE PE and DVT bilateral 06[facility]07/2014 [facility] Echocardiogram was normal January 2016 with no pulmonary hypertension 2/10/2016Factor V Leiden>>NEG Prothombin gene mutation>>neg Lupus anticoag>>POSITIVE 08/04/14: venous doppler u/s:  NEG for DVT, complete resolution except for chronic clot in RLE superficial saphenous vein  D Dimer elevated off eliquis. All VTE cleared on imaging VTE was precipitated by surgery/immobilization Plan No further anticoagulation ASA 325mg  per day for life    Updated Medication List Outpatient Encounter Prescriptions as of 09/17/2014  Medication Sig  . finasteride (PROSCAR) 5 MG tablet Take 1 tablet by mouth daily.  . tamsulosin (FLOMAX) 0.4 MG CAPS capsule Take 0.4 mg by mouth daily.  PATIENT TAKES ONE IN THE MORNING AND ONE AT NIGHT ONE DAY AND TAKES ONE AT NIGHT THE NEXT DAY AND ALTERNATES  . [DISCONTINUED] ELIQUIS 5 MG TABS tablet Take 1 tablet (5 mg total) by mouth 2 (two) times daily. (Patient not taking: Reported on 09/17/2014)

## 2014-09-18 LAB — D-DIMER, QUANTITATIVE (NOT AT ARMC): D DIMER QUANT: 0.53 ug{FEU}/mL — AB (ref 0.00–0.48)

## 2014-09-18 NOTE — Assessment & Plan Note (Signed)
HX MASSIVE PE and DVT bilateral 06/07/2014 [facility] Echocardiogram was normal January 2016 with no pulmonary hypertension 2/10/2016Factor V Leiden>>NEG Prothombin gene mutation>>neg Lupus anticoag>>POSITIVE 08/04/14: venous doppler u/s:  NEG for DVT, complete resolution except for chronic clot in RLE superficial saphenous vein  D Dimer elevated off eliquis. All VTE cleared on imaging VTE was precipitated by surgery/immobilization Plan No further anticoagulation ASA 325mg  per day for life

## 2014-12-25 ENCOUNTER — Ambulatory Visit: Payer: Medicare Other | Admitting: Internal Medicine

## 2014-12-29 ENCOUNTER — Ambulatory Visit: Payer: Medicare Other | Admitting: Internal Medicine

## 2014-12-29 ENCOUNTER — Ambulatory Visit (INDEPENDENT_AMBULATORY_CARE_PROVIDER_SITE_OTHER): Payer: Medicare Other | Admitting: Family

## 2014-12-29 ENCOUNTER — Encounter: Payer: Self-pay | Admitting: Family

## 2014-12-29 VITALS — BP 162/82 | HR 69 | Temp 98.3°F | Resp 18 | Ht 71.0 in | Wt 195.0 lb

## 2014-12-29 DIAGNOSIS — R21 Rash and other nonspecific skin eruption: Secondary | ICD-10-CM | POA: Diagnosis not present

## 2014-12-29 MED ORDER — HYDROCORTISONE 2.5 % EX CREA: TOPICAL_CREAM | Freq: Two times a day (BID) | CUTANEOUS | Status: DC

## 2014-12-29 MED ORDER — PREDNISONE 10 MG PO TABS: ORAL_TABLET | ORAL | Status: DC

## 2014-12-29 MED ORDER — AMOXICILLIN 500 MG PO CAPS: 500.0000 mg | ORAL_CAPSULE | Freq: Two times a day (BID) | ORAL | Status: DC

## 2014-12-29 NOTE — Assessment & Plan Note (Signed)
Rash consistent with insect bite pattern of varying healing. Given the number of ticks patient displayed, treat with amoxicillin secondary to allergic reactions such as a claims. Start hydrocortisone 2.5% for itching. Start prednisone taper. Follow up if symptoms worsen or fail to improve.

## 2014-12-29 NOTE — Patient Instructions (Addendum)
Thank you for choosing [facility].  Summary/Instructions:   6 Day Prednisone Taper Instructions:   Day 1: Two tablets before breakfast, one after lunch, one after dinner, and two at bedtime.  Day 2: One tablet before breakfast, one after lunch, one after dinner, and two at bedtime Day 3: One tablet before breakfast, one after lunch, one after dinner, and one at bedtime Day 4: One tablet before breakfast, one after lunch, and one at bedtime Day 5: One tablet before breakfast and one at bedtime Day 6: One tablet before breakfast  Your prescription(s) have been submitted to your pharmacy or been printed and provided for you. Please take as directed and contact our office if you believe you are having problem(s) with the medication(s) or have any questions.  If your symptoms worsen or fail to improve, please contact our office for further instruction, or in case of emergency go directly to the emergency room at the closest medical facility.   Insect Bite Mosquitoes, flies, fleas, bedbugs, and many other insects can bite. Insect bites are different from insect stings. A sting is when venom is injected into the skin. Some insect bites can transmit infectious diseases. SYMPTOMS  Insect bites usually turn red, swell, and itch for 2 to 4 days. They often go away on their own. TREATMENT  Your caregiver may prescribe antibiotic medicines if a bacterial infection develops in the bite. HOME CARE INSTRUCTIONS  Do not scratch the bite area.  Keep the bite area clean and dry. Wash the bite area thoroughly with soap and water.  Put ice or cool compresses on the bite area.  Put ice in a plastic bag.  Place a towel between your skin and the bag.  Leave the ice on for 20 minutes, 4 times a day for the first 2 to 3 days, or as directed.  You may apply a baking soda paste, cortisone cream, or calamine lotion to the bite area as directed by your caregiver. This can help reduce itching and  swelling.  Only take over-the-counter or prescription medicines as directed by your caregiver.  If you are given antibiotics, take them as directed. Finish them even if you start to feel better. You may need a tetanus shot if:  You cannot remember when you had your last tetanus shot.  You have never had a tetanus shot.  The injury broke your skin. If you get a tetanus shot, your arm may swell, get red, and feel warm to the touch. This is common and not a problem. If you need a tetanus shot and you choose not to have one, there is a rare chance of getting tetanus. Sickness from tetanus can be serious. SEEK IMMEDIATE MEDICAL CARE IF:   You have increased pain, redness, or swelling in the bite area.  You see a red line on the skin coming from the bite.  You have a fever.  You have joint pain.  You have a headache or neck pain.  You have unusual weakness.  You have a rash.  You have chest pain or shortness of breath.  You have abdominal pain, nausea, or vomiting.  You feel unusually tired or sleepy. MAKE SURE YOU:   Understand these instructions.  Will watch your condition.  Will get help right away if you are not doing well or get worse. Document Released: 06/16/2004 Document Revised: 08/01/2011 Document Reviewed: 12/08/2010 [facility] Patient Information 2015 [place], [place]. This information is not intended to replace advice given to you by  your health care provider. Make sure you discuss any questions you have with your health care provider.

## 2014-12-29 NOTE — Progress Notes (Signed)
Pre visit review using our clinic review tool, if applicable. No additional management support is needed unless otherwise documented below in the visit note. 

## 2014-12-29 NOTE — Progress Notes (Signed)
   Subjective:    Patient ID: Richard Cannon, male    DOB: August 22, 1934, 80 y.o.   MRN: [id number]  Chief Complaint  Patient presents with  . Follow-up    has bug bites all over his legs, also kept all the ticks that he found on him,     HPI:  Richard Cannon is a 80 y.o. male with a PMH of inguinal hernia, lupus anticoagulant positive, pulmonary embolism, and benign prostatic hypertrophy who presents today for an office visit.     1.) Itching - Associated symptom of itching has been going on for about 1 week. He found several small ticks which he presents on a piece of tape. Believes that most were there for less than about 48 hours. There was one that left a small knot. Modifying factors include neosporin which helped a little.   Allergies  Allergen Reactions  . Iodine   . Tetracyclines & Related     Hospitalized after taking - unsure of reaction    Current Outpatient Prescriptions on File Prior to Visit  Medication Sig Dispense Refill  . finasteride (PROSCAR) 5 MG tablet Take 1 tablet by mouth daily.  2  . tamsulosin (FLOMAX) 0.4 MG CAPS capsule Take 0.4 mg by mouth daily. PATIENT TAKES ONE IN THE MORNING AND ONE AT NIGHT ONE DAY AND TAKES ONE AT NIGHT THE NEXT DAY AND ALTERNATES  1   No current facility-administered medications on file prior to visit.     Review of Systems  Constitutional: Negative for fever and chills.  Skin: Positive for wound. Negative for rash.      Objective:    BP 162/82 mmHg  Pulse 69  Temp(Src) 98.3 F (36.8 C) (Oral)  Resp 18  Ht  (1.803 m)  Wt 195 lb (88.451 kg)  BMI 27.21 kg/m2  SpO2 96% Nursing note and vital signs reviewed.   Physical Exam  Constitutional: He is oriented to person, place, and time. He appears well-developed and well-nourished. No distress.  Cardiovascular: Normal rate, regular rhythm, normal heart sounds and intact distal pulses.   Pulmonary/Chest: Effort normal and breath sounds normal.  Neurological: He  is alert and oriented to person, place, and time.  Skin: Skin is warm and dry.  Annular lesions in a variety of healing stages are sporadically noted at the lower extremity and trunk.  Psychiatric: He has a normal mood and affect. His behavior is normal. Judgment and thought content normal.       Assessment & Plan:   Problem List Items Addressed This Visit      Musculoskeletal and Integument   Rash and nonspecific skin eruption - Primary    Rash consistent with insect bite pattern of varying healing. Given the number of ticks patient displayed, treat with amoxicillin secondary to allergic reactions such as a claims. Start hydrocortisone 2.5% for itching. Start prednisone taper. Follow up if symptoms worsen or fail to improve.      Relevant Medications   amoxicillin (AMOXIL) 500 MG capsule   hydrocortisone 2.5 % cream   predniSONE (DELTASONE) 10 MG tablet

## 2015-07-13 DIAGNOSIS — H401131 Primary open-angle glaucoma, bilateral, mild stage: Secondary | ICD-10-CM | POA: Diagnosis not present

## 2015-08-27 ENCOUNTER — Ambulatory Visit (INDEPENDENT_AMBULATORY_CARE_PROVIDER_SITE_OTHER): Payer: Medicare Other | Admitting: Otolaryngology

## 2015-08-27 ENCOUNTER — Other Ambulatory Visit: Payer: Self-pay | Admitting: Otolaryngology

## 2015-08-27 DIAGNOSIS — J310 Chronic rhinitis: Secondary | ICD-10-CM | POA: Diagnosis not present

## 2015-08-27 DIAGNOSIS — J343 Hypertrophy of nasal turbinates: Secondary | ICD-10-CM | POA: Diagnosis not present

## 2015-08-27 DIAGNOSIS — J342 Deviated nasal septum: Secondary | ICD-10-CM

## 2015-09-16 DIAGNOSIS — L570 Actinic keratosis: Secondary | ICD-10-CM | POA: Diagnosis not present

## 2015-09-16 DIAGNOSIS — X32XXXD Exposure to sunlight, subsequent encounter: Secondary | ICD-10-CM | POA: Diagnosis not present

## 2015-09-16 DIAGNOSIS — D225 Melanocytic nevi of trunk: Secondary | ICD-10-CM | POA: Diagnosis not present

## 2015-09-16 DIAGNOSIS — L820 Inflamed seborrheic keratosis: Secondary | ICD-10-CM | POA: Diagnosis not present

## 2015-09-28 ENCOUNTER — Encounter (HOSPITAL_BASED_OUTPATIENT_CLINIC_OR_DEPARTMENT_OTHER): Payer: Self-pay | Admitting: *Deleted

## 2015-09-30 DIAGNOSIS — Z0000 Encounter for general adult medical examination without abnormal findings: Secondary | ICD-10-CM | POA: Diagnosis not present

## 2015-09-30 DIAGNOSIS — N401 Enlarged prostate with lower urinary tract symptoms: Secondary | ICD-10-CM | POA: Diagnosis not present

## 2015-09-30 DIAGNOSIS — N5201 Erectile dysfunction due to arterial insufficiency: Secondary | ICD-10-CM | POA: Diagnosis not present

## 2015-09-30 DIAGNOSIS — R3916 Straining to void: Secondary | ICD-10-CM | POA: Diagnosis not present

## 2015-10-05 ENCOUNTER — Encounter (HOSPITAL_BASED_OUTPATIENT_CLINIC_OR_DEPARTMENT_OTHER)
Admission: RE | Disposition: A | Discharge status: Home / Self Care | Payer: Self-pay | Source: Ambulatory Visit | Attending: Otolaryngology

## 2015-10-05 ENCOUNTER — Encounter (HOSPITAL_BASED_OUTPATIENT_CLINIC_OR_DEPARTMENT_OTHER): Payer: Self-pay

## 2015-10-05 ENCOUNTER — Ambulatory Visit (HOSPITAL_BASED_OUTPATIENT_CLINIC_OR_DEPARTMENT_OTHER): Payer: Medicare Other | Admitting: Anesthesiology

## 2015-10-05 ENCOUNTER — Ambulatory Visit (HOSPITAL_BASED_OUTPATIENT_CLINIC_OR_DEPARTMENT_OTHER)
Admission: RE | Admit: 2015-10-05 | Discharge: 2015-10-05 | Disposition: A | Discharge status: Home / Self Care | Payer: Medicare Other | Source: Ambulatory Visit | Attending: Otolaryngology | Admitting: Otolaryngology

## 2015-10-05 DIAGNOSIS — J310 Chronic rhinitis: Secondary | ICD-10-CM | POA: Diagnosis not present

## 2015-10-05 DIAGNOSIS — Z79899 Other long term (current) drug therapy: Secondary | ICD-10-CM | POA: Diagnosis not present

## 2015-10-05 DIAGNOSIS — J342 Deviated nasal septum: Secondary | ICD-10-CM | POA: Insufficient documentation

## 2015-10-05 DIAGNOSIS — J3489 Other specified disorders of nose and nasal sinuses: Secondary | ICD-10-CM | POA: Diagnosis not present

## 2015-10-05 DIAGNOSIS — J343 Hypertrophy of nasal turbinates: Secondary | ICD-10-CM | POA: Insufficient documentation

## 2015-10-05 HISTORY — PX: SEPTOPLASTY: SHX2393

## 2015-10-05 HISTORY — DX: Deviated nasal septum: J34.2

## 2015-10-05 HISTORY — PX: TURBINATE REDUCTION: SHX6157

## 2015-10-05 SURGERY — SEPTOPLASTY, NOSE
Anesthesia: General | Site: Nose

## 2015-10-05 MED ORDER — AMOXICILLIN-POT CLAVULANATE 875-125 MG PO TABS: 1.0000 | ORAL_TABLET | Freq: Two times a day (BID) | ORAL | Status: DC

## 2015-10-05 MED ORDER — LIDOCAINE-EPINEPHRINE 1 %-1:100000 IJ SOLN
INTRAMUSCULAR | Status: AC
  Filled 2015-10-05: qty 1

## 2015-10-05 MED ORDER — LACTATED RINGERS IV SOLN
INTRAVENOUS | Status: DC
  Administered 2015-10-05 (×2): via INTRAVENOUS

## 2015-10-05 MED ORDER — SCOPOLAMINE 1 MG/3DAYS TD PT72: 1.0000 | MEDICATED_PATCH | Freq: Once | TRANSDERMAL | Status: DC | PRN

## 2015-10-05 MED ORDER — GLYCOPYRROLATE 0.2 MG/ML IJ SOLN: 0.2000 mg | Freq: Once | INTRAMUSCULAR | Status: DC | PRN

## 2015-10-05 MED ORDER — MIDAZOLAM HCL 2 MG/2ML IJ SOLN: 1.0000 mg | INTRAMUSCULAR | Status: DC | PRN

## 2015-10-05 MED ORDER — OXYCODONE HCL 5 MG PO TABS: 5.0000 mg | ORAL_TABLET | Freq: Once | ORAL | Status: DC | PRN

## 2015-10-05 MED ORDER — OXYCODONE HCL 5 MG/5ML PO SOLN: 5.0000 mg | Freq: Once | ORAL | Status: DC | PRN

## 2015-10-05 MED ORDER — DEXAMETHASONE SODIUM PHOSPHATE 4 MG/ML IJ SOLN
INTRAMUSCULAR | Status: DC | PRN
  Administered 2015-10-05: 10 mg via INTRAVENOUS

## 2015-10-05 MED ORDER — PROPOFOL 10 MG/ML IV BOLUS
INTRAVENOUS | Status: DC | PRN
  Administered 2015-10-05: 50 mg via INTRAVENOUS
  Administered 2015-10-05: 200 mg via INTRAVENOUS

## 2015-10-05 MED ORDER — OXYMETAZOLINE HCL 0.05 % NA SOLN
NASAL | Status: AC
  Filled 2015-10-05: qty 15

## 2015-10-05 MED ORDER — SUCCINYLCHOLINE CHLORIDE 20 MG/ML IJ SOLN
INTRAMUSCULAR | Status: DC | PRN
  Administered 2015-10-05: 100 mg via INTRAVENOUS

## 2015-10-05 MED ORDER — MUPIROCIN 2 % EX OINT
TOPICAL_OINTMENT | CUTANEOUS | Status: DC | PRN
  Administered 2015-10-05: 1 via NASAL

## 2015-10-05 MED ORDER — CEFAZOLIN SODIUM 1 G IJ SOLR
INTRAMUSCULAR | Status: AC
  Filled 2015-10-05: qty 20

## 2015-10-05 MED ORDER — HYDROMORPHONE HCL 1 MG/ML IJ SOLN: 0.2500 mg | INTRAMUSCULAR | Status: DC | PRN

## 2015-10-05 MED ORDER — OXYCODONE-ACETAMINOPHEN 5-325 MG PO TABS: 1.0000 | ORAL_TABLET | ORAL | Status: DC | PRN

## 2015-10-05 MED ORDER — MUPIROCIN 2 % EX OINT
TOPICAL_OINTMENT | CUTANEOUS | Status: AC
  Filled 2015-10-05: qty 22

## 2015-10-05 MED ORDER — ONDANSETRON HCL 4 MG/2ML IJ SOLN
INTRAMUSCULAR | Status: DC | PRN
  Administered 2015-10-05: 4 mg via INTRAVENOUS

## 2015-10-05 MED ORDER — LIDOCAINE 2% (20 MG/ML) 5 ML SYRINGE
INTRAMUSCULAR | Status: AC
  Filled 2015-10-05: qty 5

## 2015-10-05 MED ORDER — CEFAZOLIN SODIUM-DEXTROSE 2-3 GM-% IV SOLR
INTRAVENOUS | Status: DC | PRN
  Administered 2015-10-05: 2 g via INTRAVENOUS

## 2015-10-05 MED ORDER — FENTANYL CITRATE (PF) 100 MCG/2ML IJ SOLN
INTRAMUSCULAR | Status: AC
  Filled 2015-10-05: qty 2

## 2015-10-05 MED ORDER — FENTANYL CITRATE (PF) 100 MCG/2ML IJ SOLN
50.0000 ug | INTRAMUSCULAR | Status: DC | PRN
  Administered 2015-10-05: 75 ug via INTRAVENOUS
  Administered 2015-10-05: 25 ug via INTRAVENOUS

## 2015-10-05 MED ORDER — LIDOCAINE-EPINEPHRINE 1 %-1:100000 IJ SOLN
INTRAMUSCULAR | Status: DC | PRN
  Administered 2015-10-05: 2.5 mL

## 2015-10-05 MED ORDER — BACITRACIN ZINC 500 UNIT/GM EX OINT
TOPICAL_OINTMENT | CUTANEOUS | Status: AC
  Filled 2015-10-05: qty 28.35

## 2015-10-05 MED ORDER — OXYMETAZOLINE HCL 0.05 % NA SOLN
NASAL | Status: DC | PRN
  Administered 2015-10-05: 1 via TOPICAL

## 2015-10-05 MED ORDER — LIDOCAINE HCL (CARDIAC) 20 MG/ML IV SOLN
INTRAVENOUS | Status: DC | PRN
  Administered 2015-10-05: 50 mg via INTRAVENOUS

## 2015-10-05 MED ORDER — ONDANSETRON HCL 4 MG/2ML IJ SOLN: 4.0000 mg | Freq: Four times a day (QID) | INTRAMUSCULAR | Status: DC | PRN

## 2015-10-05 MED ORDER — ONDANSETRON HCL 4 MG/2ML IJ SOLN
INTRAMUSCULAR | Status: AC
  Filled 2015-10-05: qty 2

## 2015-10-05 SURGICAL SUPPLY — 33 items
ATTRACTOMAT 16X20 MAGNETIC DRP (DRAPES) IMPLANT
BLADE SURG 15 STRL LF DISP TIS (BLADE) IMPLANT
BLADE SURG 15 STRL SS (BLADE)
CANISTER SUCT 1200ML W/VALVE (MISCELLANEOUS) ×3 IMPLANT
COAGULATOR SUCT 8FR VV (MISCELLANEOUS) ×3 IMPLANT
DECANTER SPIKE VIAL GLASS SM (MISCELLANEOUS) IMPLANT
DRSG NASOPORE 8CM (GAUZE/BANDAGES/DRESSINGS) IMPLANT
DRSG TELFA 3X8 NADH (GAUZE/BANDAGES/DRESSINGS) IMPLANT
ELECT REM PT RETURN 9FT ADLT (ELECTROSURGICAL) ×3
ELECTRODE REM PT RTRN 9FT ADLT (ELECTROSURGICAL) ×2 IMPLANT
GLOVE BIO SURGEON STRL SZ7.5 (GLOVE) ×3 IMPLANT
GLOVE SURG SS PI 7.0 STRL IVOR (GLOVE) ×3 IMPLANT
GOWN STRL REUS W/ TWL LRG LVL3 (GOWN DISPOSABLE) ×4 IMPLANT
GOWN STRL REUS W/TWL LRG LVL3 (GOWN DISPOSABLE) ×2
NEEDLE HYPO 25X1 1.5 SAFETY (NEEDLE) ×3 IMPLANT
NS IRRIG 1000ML POUR BTL (IV SOLUTION) ×3 IMPLANT
PACK BASIN DAY SURGERY FS (CUSTOM PROCEDURE TRAY) ×3 IMPLANT
PACK ENT DAY SURGERY (CUSTOM PROCEDURE TRAY) ×3 IMPLANT
PACKING NASAL EPIS 4X2.4 XEROG (MISCELLANEOUS) IMPLANT
PATTIES SURGICAL .5 X3 (DISPOSABLE) IMPLANT
SLEEVE SCD COMPRESS KNEE MED (MISCELLANEOUS) ×3 IMPLANT
SOLUTION BUTLER CLEAR DIP (MISCELLANEOUS) ×3 IMPLANT
SPLINT NASAL AIRWAY SILICONE (MISCELLANEOUS) ×3 IMPLANT
SPONGE GAUZE 2X2 8PLY STRL LF (GAUZE/BANDAGES/DRESSINGS) ×3 IMPLANT
SPONGE NEURO XRAY DETECT 1X3 (DISPOSABLE) ×3 IMPLANT
SUT CHROMIC 4 0 P 3 18 (SUTURE) ×3 IMPLANT
SUT PLAIN 4 0 ~~LOC~~ 1 (SUTURE) ×3 IMPLANT
SUT PROLENE 3 0 PS 2 (SUTURE) ×3 IMPLANT
SUT VIC AB 4-0 P-3 18XBRD (SUTURE) IMPLANT
SUT VIC AB 4-0 P3 18 (SUTURE)
TOWEL OR 17X24 6PK STRL BLUE (TOWEL DISPOSABLE) ×3 IMPLANT
TUBE SALEM SUMP 16 FR W/ARV (TUBING) IMPLANT
YANKAUER SUCT BULB TIP NO VENT (SUCTIONS) IMPLANT

## 2015-10-05 NOTE — Transfer of Care (Signed)
Immediate Anesthesia Transfer of Care Note  Patient: Richard Cannon  Procedure(s) Performed: Procedure(s): SEPTOPLASTY (N/A) BILATERAL TURBINATE REDUCTION (Bilateral)  Patient Location: PACU  Anesthesia Type:General  Level of Consciousness: awake  Airway & Oxygen Therapy: Patient Spontanous Breathing and Patient connected to face mask oxygen  Post-op Assessment: Report given to RN and Post -op Vital signs reviewed and stable  Post vital signs: Reviewed and stable  Last Vitals:  Filed Vitals:   10/05/15 0911  BP: 135/80  Pulse: 85  Temp: 36.7 C  Resp: 18    Last Pain: There were no vitals filed for this visit.       Complications: No apparent anesthesia complications

## 2015-10-05 NOTE — Anesthesia Postprocedure Evaluation (Signed)
Anesthesia Post Note  Patient: Richard Cannon  Procedure(s) Performed: Procedure(s) (LRB): SEPTOPLASTY (N/A) BILATERAL TURBINATE REDUCTION (Bilateral)  Patient location during evaluation: PACU Anesthesia Type: General Level of consciousness: awake and alert and patient cooperative Pain management: pain level controlled Vital Signs Assessment: post-procedure vital signs reviewed and stable Respiratory status: spontaneous breathing and respiratory function stable Cardiovascular status: stable Anesthetic complications: no    Last Vitals:  Filed Vitals:   10/05/15 1200 10/05/15 1215  BP: 140/78 155/78  Pulse: 86 81  Temp:    Resp: 16 16    Last Pain:  Filed Vitals:   10/05/15 1223  PainSc: 0-No pain                 Simar Pothier S

## 2015-10-05 NOTE — H&P (Signed)
Cc: Chronic nasal obstruction  HPI: The patient is a 81 year old male who presents today complaining of chronic nasal obstruction for many years. His nasal obstruction is worse on the left side. The patient was previously seen in 2014. At that time, he was noted to have significant nasal septal deviation and bilateral inferior turbinate hypertrophy. The patient was treated with decongestant and antihistamines. He could not tolerate the use of steroid nasal sprays due to his glaucoma. According to the patient, his nasal obstruction has worsened. He is now a habitual mouth breather. He is interested in more definitive treatment of his condition.  The patient's review of systems (constitutional, eyes, ENT, cardiovascular, respiratory, GI, musculoskeletal, skin, neurologic, psychiatric, endocrine, hematologic, allergic) is noted in the ROS questionnaire.  It is reviewed with the patient.  Allergies: Iodine, Tetracyclin.   Family health history: None.   Major events: Wrist surgery, hernia surgery, postop DVT.  Ongoing medical problems: Reflux, Glaucoma.   Social history: The patient is married. He denies the use of tobacco, alcohol or illegal drugs.  Exam General: Communicates without difficulty, well nourished, no acute distress.  Head: Normocephalic, no evidence injury, no tenderness, facial buttresses intact without stepoff.  Eyes: PERRL, EOMI. No scleral icterus, conjunctivae clear.  Neuro: CN II exam reveals vision grossly intact.  No nystagmus at any point of gaze.  Ears: Auricles well formed without lesions.  Ear canals are intact without mass or lesion.  No erythema or edema is appreciated.  The TMs are intact without fluid.  Nose: External evaluation reveals normal support and skin without lesions.  Dorsum is intact.  Anterior rhinoscopy reveals congested and edematous mucosa over anterior aspect of the inferior turbinates and nasal septum.  No purulence is noted. Middle meatus is not well  visualized. Nasal septum: Leftward deviation but intact.  Oral:  Oral cavity and oropharynx are intact, symmetric, without erythema or edema.  Mucosa is moist without lesions.  Neck: Full range of motion without pain.  There is no significant lymphadenopathy.  No masses palpable.  Thyroid bed within normal limits to palpation.  Parotid glands and submandibular glands equal bilaterally without mass.  Trachea is midline.  Neuro:  CN 2-12 grossly intact. Gait normal.  Vestibular: No nystagmus at any point of gaze.    Procedure:  Flexible Nasal Endoscopy: Risks, benefits, and alternatives of flexible endoscopy were explained to the patient.  Specific mention was made of the risk of throat numbness with difficulty swallowing, possible bleeding from the nose and mouth, and pain from the procedure.  The patient gave oral consent to proceed.  The nasal cavities were decongested and anesthetised with a combination of oxymetazoline and 4% lidocaine solution.  The flexible scope was inserted into the right nasal cavity.  Endoscopy of the inferior and middle meatus was performed.  The edematous mucosa was as described above.  No polyp, mass, or lesion was appreciated.  Olfactory cleft was clear.  Nasopharynx was clear.  Turbinates were hypertrophied but without mass.  Incomplete response to decongestion.  The procedure was repeated on the contralateral side.  Severe left septal deviation with large spur was noted.  No evidence of ulcer or lesion.  The patient tolerated the procedure well.  Instructions were given to avoid eating or drinking for 2 hours.  Assessment 1. Chronic rhinitis, with severe nasal mucosal congestion, severe nasal septal deviation, and bilateral inferior turbinate hypertrophy. More than 95% of his nasal passageways are obstructed bilaterally. 2. No polyps, mass, or lesion is noted  on today's nasal endoscopy examination.  Plan  1. The physical exam and nasal endoscopy findings are reviewed with the  patient. 2. Based on the severity of his nasal obstruction, he will likely need to undergo septoplasty and bilateral turbinate reduction to improve his nasal passageways. The risks, benefits, alternatives, and details of the procedures are reviewed with the patient. 3. The patient would like to proceed with the procedures. 4. We will schedule the procedures in accordance with the family schedule.

## 2015-10-05 NOTE — Anesthesia Preprocedure Evaluation (Signed)
Anesthesia Evaluation  Patient identified by MRN, date of birth, ID band Patient awake    Reviewed: Allergy & Precautions, H&P , NPO status , Patient's Chart, lab work & pertinent test results  Airway Mallampati: II   Neck ROM: full    Dental   Pulmonary    breath sounds clear to auscultation       Cardiovascular negative cardio ROS   Rhythm:regular Rate:Normal     Neuro/Psych    GI/Hepatic   Endo/Other    Renal/GU      Musculoskeletal   Abdominal   Peds  Hematology   Anesthesia Other Findings   Reproductive/Obstetrics                             Anesthesia Physical Anesthesia Plan  ASA: II  Anesthesia Plan: General   Post-op Pain Management:    Induction: Intravenous  Airway Management Planned: Oral ETT  Additional Equipment:   Intra-op Plan:   Post-operative Plan: Extubation in OR  Informed Consent: I have reviewed the patients History and Physical, chart, labs and discussed the procedure including the risks, benefits and alternatives for the proposed anesthesia with the patient or authorized representative who has indicated his/her understanding and acceptance.     Plan Discussed with: CRNA, Anesthesiologist and Surgeon  Anesthesia Plan Comments:         Anesthesia Quick Evaluation

## 2015-10-05 NOTE — Anesthesia Procedure Notes (Signed)
Procedure Name: Intubation Date/Time: 10/05/2015 10:38 AM Performed by: Caren MacadamARTER, Abrar Koone W Pre-anesthesia Checklist: Patient identified, Emergency Drugs available, Suction available and Patient being monitored Patient Re-evaluated:Patient Re-evaluated prior to inductionOxygen Delivery Method: Circle System Utilized Preoxygenation: Pre-oxygenation with 100% oxygen Intubation Type: IV induction Ventilation: Mask ventilation without difficulty Laryngoscope Size: Miller and 2 Grade View: Grade I Tube type: Oral Tube size: 8.0 mm Number of attempts: 1 Airway Equipment and Method: Stylet and Oral airway Placement Confirmation: ETT inserted through vocal cords under direct vision,  positive ETCO2 and breath sounds checked- equal and bilateral Secured at: 23 cm Tube secured with: Tape Dental Injury: Teeth and Oropharynx as per pre-operative assessment

## 2015-10-05 NOTE — Discharge Instructions (Addendum)

## 2015-10-05 NOTE — Op Note (Signed)
DATE OF PROCEDURE: 10/05/2015  OPERATIVE REPORT   SURGEON: [name], MD   PREOPERATIVE DIAGNOSES:  1. Severe nasal septal deviation.  2. Bilateral inferior turbinate hypertrophy.  3. Chronic nasal obstruction.  POSTOPERATIVE DIAGNOSES:  1. Severe nasal septal deviation.  2. Bilateral inferior turbinate hypertrophy.  3. Chronic nasal obstruction.  PROCEDURE PERFORMED:  1. Septoplasty.  2. Bilateral partial inferior turbinate resection.   ANESTHESIA: General endotracheal tube anesthesia.   COMPLICATIONS: None.   ESTIMATED BLOOD LOSS: Less than 50 mL.   INDICATION FOR PROCEDURE: Richard Cannon is a 81 y.o. male with a history of chronic nasal obstruction. The patient was  treated with antihistamine, decongestant, steroid nasal spray, and systemic steroids. However, the patient continues to be symptomatic. On examination, the patient was noted to have bilateral severe inferior turbinate hypertrophy and significant nasal septal deviation, causing significant nasal obstruction. Based on the above findings, the decision was made for the patient to undergo the above-stated procedure. The risks, benefits, alternatives, and details of the procedure were discussed with the patient. Questions were invited and answered. Informed consent was obtained.   DESCRIPTION OF PROCEDURE: The patient was taken to the operating room and placed supine on the operating table. General endotracheal tube anesthesia was administered by the anesthesiologist. The patient was positioned, and prepped and draped in the standard fashion for nasal surgery. Pledgets soaked with Afrin were placed in both nasal cavities for decongestion. The pledgets were subsequently removed. The above mentioned severe septal deviation was again noted. 1% lidocaine with 1:100,000 epinephrine was injected onto the nasal septum bilaterally. A hemitransfixion incision was made on the left side. The mucosal flap was carefully elevated on the left  side. A cartilaginous incision was made 1 cm superior to the caudal margin of the nasal septum. Mucosal flap was also elevated on the right side in the similar fashion. It should be noted that due to the severe septal deviation, the deviated portion of the cartilaginous and bony septum had to be removed in piecemeal fashion. Once the deviated portions were removed, a straight midline septum was achieved. The septum was then quilted with 4-0 plain gut sutures. The hemitransfixion incision was closed with interrupted 4-0 chromic sutures. Doyle splints were applied.   [facility] to [facility], the inferior one half of both hypertrophied inferior turbinate was crossclamped with a Kelly clamp. The inferior one half of each inferior turbinate was then resected with a pair of cross cutting scissors. Hemostasis was achieved with a suction cautery device.   The care of the patient was turned over to the anesthesiologist. The patient was awakened from anesthesia without difficulty. The patient was extubated and transferred to the recovery room in good condition.   OPERATIVE FINDINGS: Severe nasal septal deviation and bilateral inferior turbinate hypertrophy.   SPECIMEN: None.   FOLLOWUP CARE: The patient be discharged home once he is awake and alert. The patient will be placed on Percocet 1-2 tablets p.o. q.6 hours p.r.n. pain, and augmentin 875 mg p.o. b.i.d. for 10 days. The patient will follow up in my office in approximately 1 week for splint removal.   [name], MD

## 2015-10-06 ENCOUNTER — Encounter (HOSPITAL_BASED_OUTPATIENT_CLINIC_OR_DEPARTMENT_OTHER): Payer: Self-pay | Admitting: Otolaryngology

## 2015-10-06 NOTE — Addendum Note (Signed)
Addendum  created 10/06/15 0815 by Jewel Baizeimothy D Lauri Purdum, CRNA   Modules edited: Charges VN

## 2015-10-08 ENCOUNTER — Ambulatory Visit (INDEPENDENT_AMBULATORY_CARE_PROVIDER_SITE_OTHER): Payer: Medicare Other | Admitting: Otolaryngology

## 2015-10-22 ENCOUNTER — Ambulatory Visit (INDEPENDENT_AMBULATORY_CARE_PROVIDER_SITE_OTHER): Payer: Medicare Other | Admitting: Otolaryngology

## 2016-03-09 DIAGNOSIS — H2513 Age-related nuclear cataract, bilateral: Secondary | ICD-10-CM | POA: Diagnosis not present

## 2016-03-09 DIAGNOSIS — H40053 Ocular hypertension, bilateral: Secondary | ICD-10-CM | POA: Diagnosis not present

## 2016-04-25 ENCOUNTER — Ambulatory Visit (INDEPENDENT_AMBULATORY_CARE_PROVIDER_SITE_OTHER): Payer: Medicare Other | Admitting: Internal Medicine

## 2016-04-25 ENCOUNTER — Encounter: Payer: Self-pay | Admitting: Internal Medicine

## 2016-04-25 ENCOUNTER — Other Ambulatory Visit (INDEPENDENT_AMBULATORY_CARE_PROVIDER_SITE_OTHER): Payer: Medicare Other

## 2016-04-25 VITALS — BP 140/80 | HR 55 | Temp 98.9°F | Resp 18 | Ht 71.0 in | Wt 196.0 lb

## 2016-04-25 DIAGNOSIS — Z0000 Encounter for general adult medical examination without abnormal findings: Secondary | ICD-10-CM | POA: Diagnosis not present

## 2016-04-25 DIAGNOSIS — R350 Frequency of micturition: Secondary | ICD-10-CM | POA: Diagnosis not present

## 2016-04-25 DIAGNOSIS — N401 Enlarged prostate with lower urinary tract symptoms: Secondary | ICD-10-CM

## 2016-04-25 DIAGNOSIS — Z23 Encounter for immunization: Secondary | ICD-10-CM | POA: Diagnosis not present

## 2016-04-25 LAB — CBC
HCT: 48.1 % (ref 39.0–52.0)
HEMOGLOBIN: 16.5 g/dL (ref 13.0–17.0)
MCHC: 34.2 g/dL (ref 30.0–36.0)
MCV: 96.8 fl (ref 78.0–100.0)
Platelets: 279 10*3/uL (ref 150.0–400.0)
RBC: 4.98 Mil/uL (ref 4.22–5.81)
RDW: 13.8 % (ref 11.5–15.5)
WBC: 7.1 10*3/uL (ref 4.0–10.5)

## 2016-04-25 LAB — LIPID PANEL
Cholesterol: 194 mg/dL (ref 0–200)
HDL: 41.4 mg/dL (ref >39.00)
LDL Cholesterol: 125 mg/dL — ABNORMAL HIGH (ref 0–99)
NONHDL: 152.31
TRIGLYCERIDES: 138 mg/dL (ref 0.0–149.0)
Total CHOL/HDL Ratio: 5
VLDL: 27.6 mg/dL (ref 0.0–40.0)

## 2016-04-25 LAB — COMPREHENSIVE METABOLIC PANEL
ALK PHOS: 77 U/L (ref 39–117)
ALT: 17 U/L (ref 0–53)
AST: 20 U/L (ref 0–37)
Albumin: 4.1 g/dL (ref 3.5–5.2)
BILIRUBIN TOTAL: 0.5 mg/dL (ref 0.2–1.2)
BUN: 20 mg/dL (ref 6–23)
CO2: 24 meq/L (ref 19–32)
Calcium: 9 mg/dL (ref 8.4–10.5)
Chloride: 106 mEq/L (ref 96–112)
Creatinine, Ser: 1.14 mg/dL (ref 0.40–1.50)
GFR: 65.83 mL/min (ref >60.00)
GLUCOSE: 100 mg/dL — AB (ref 70–99)
Potassium: 4.4 mEq/L (ref 3.5–5.1)
SODIUM: 139 meq/L (ref 135–145)
TOTAL PROTEIN: 6.6 g/dL (ref 6.0–8.3)

## 2016-04-25 MED ORDER — SILDENAFIL CITRATE 100 MG PO TABS
50.0000 mg | ORAL_TABLET | Freq: Every day | ORAL | 11 refills | Status: DC | PRN
Start: 2016-04-25 — End: 2023-12-25

## 2016-04-25 NOTE — Assessment & Plan Note (Signed)
Taking ASA daily, no recurrence and prior was associated with surgery and was likely secondary.

## 2016-04-25 NOTE — Patient Instructions (Signed)
We will send in the viagra for you to try if needed.   We are checking the labs today and will call you back with the results.   Health Maintenance, Male A healthy lifestyle and preventative care can promote health and wellness.  Maintain regular health, dental, and eye exams.  Eat a healthy diet. Foods like vegetables, fruits, whole grains, low-fat dairy products, and lean protein foods contain the nutrients you need and are low in calories. Decrease your intake of foods high in solid fats, added sugars, and salt. Get information about a proper diet from your health care provider, if necessary.  Regular physical exercise is one of the most important things you can do for your health. Most adults should get at least 150 minutes of moderate-intensity exercise (any activity that increases your heart rate and causes you to sweat) each week. In addition, most adults need muscle-strengthening exercises on 2 or more days a week.   Maintain a healthy weight. The body mass index (BMI) is a screening tool to identify possible weight problems. It provides an estimate of body fat based on height and weight. Your health care provider can find your BMI and can help you achieve or maintain a healthy weight. For males 20 years and older:  A BMI below 18.5 is considered underweight.  A BMI of 18.5 to 24.9 is normal.  A BMI of 25 to 29.9 is considered overweight.  A BMI of 30 and above is considered obese.  Maintain normal blood lipids and cholesterol by exercising and minimizing your intake of saturated fat. Eat a balanced diet with plenty of fruits and vegetables. Blood tests for lipids and cholesterol should begin at age 79 and be repeated every 5 years. If your lipid or cholesterol levels are high, you are over age 79, or you are at high risk for heart disease, you may need your cholesterol levels checked more frequently.Ongoing high lipid and cholesterol levels should be treated with medicines if diet  and exercise are not working.  If you smoke, find out from your health care provider how to quit. If you do not use tobacco, do not start.  Lung cancer screening is recommended for adults aged 55-80 years who are at high risk for developing lung cancer because of a history of smoking. A yearly low-dose CT scan of the lungs is recommended for people who have at least a 30-pack-year history of smoking and are current smokers or have quit within the past 15 years. A pack year of smoking is smoking an average of 1 pack of cigarettes a day for 1 year (for example, a 30-pack-year history of smoking could mean smoking 1 pack a day for 30 years or 2 packs a day for 15 years). Yearly screening should continue until the smoker has stopped smoking for at least 15 years. Yearly screening should be stopped for people who develop a health problem that would prevent them from having lung cancer treatment.  If you choose to drink alcohol, do not have more than 2 drinks per day. One drink is considered to be 12 oz (360 mL) of beer, 5 oz (150 mL) of wine, or 1.5 oz (45 mL) of liquor.  Avoid the use of street drugs. Do not share needles with anyone. Ask for help if you need support or instructions about stopping the use of drugs.  High blood pressure causes heart disease and increases the risk of stroke. High blood pressure is more likely to develop in:  People who have blood pressure in the end of the normal range (100-139/85-89 mm Hg).  People who are overweight or obese.  People who are African American.  If you are 83-104 years of age, have your blood pressure checked every 3-5 years. If you are 13 years of age or older, have your blood pressure checked every year. You should have your blood pressure measured twice-once when you are at a hospital or clinic, and once when you are not at a hospital or clinic. Record the average of the two measurements. To check your blood pressure when you are not at a hospital or  clinic, you can use:  An automated blood pressure machine at a pharmacy.  A home blood pressure monitor.  If you are 47-60 years old, ask your health care provider if you should take aspirin to prevent heart disease.  Diabetes screening involves taking a blood sample to check your fasting blood sugar level. This should be done once every 3 years after age 74 if you are at a normal weight and without risk factors for diabetes. Testing should be considered at a younger age or be carried out more frequently if you are overweight and have at least 1 risk factor for diabetes.  Colorectal cancer can be detected and often prevented. Most routine colorectal cancer screening begins at the age of 54 and continues through age 41. However, your health care provider may recommend screening at an earlier age if you have risk factors for colon cancer. On a yearly basis, your health care provider may provide home test kits to check for hidden blood in the stool. A small camera at the end of a tube may be used to directly examine the colon (sigmoidoscopy or colonoscopy) to detect the earliest forms of colorectal cancer. Talk to your health care provider about this at age 53 when routine screening begins. A direct exam of the colon should be repeated every 5-10 years through age 1, unless early forms of precancerous polyps or small growths are found.  People who are at an increased risk for hepatitis B should be screened for this virus. You are considered at high risk for hepatitis B if:  You were born in a country where hepatitis B occurs often. Talk with your health care provider about which countries are considered high risk.  Your parents were born in a high-risk country and you have not received a shot to protect against hepatitis B (hepatitis B vaccine).  You have HIV or AIDS.  You use needles to inject street drugs.  You live with, or have sex with, someone who has hepatitis B.  You are a man who has  sex with other men (MSM).  You get hemodialysis treatment.  You take certain medicines for conditions like cancer, organ transplantation, and autoimmune conditions.  Hepatitis C blood testing is recommended for all people born from [birth year] through 1963 and any individual with known risk factors for hepatitis C.  Healthy men should no longer receive prostate-specific antigen (PSA) blood tests as part of routine cancer screening. Talk to your health care provider about prostate cancer screening.  Testicular cancer screening is not recommended for adolescents or adult males who have no symptoms. Screening includes self-exam, a health care provider exam, and other screening tests. Consult with your health care provider about any symptoms you have or any concerns you have about testicular cancer.  Practice safe sex. Use condoms and avoid high-risk sexual practices to reduce the spread of  sexually transmitted infections (STIs).  You should be screened for STIs, including gonorrhea and chlamydia if:  You are sexually active and are younger than 24 years.  You are older than 24 years, and your health care provider tells you that you are at risk for this type of infection.  Your sexual activity has changed since you were last screened, and you are at an increased risk for chlamydia or gonorrhea. Ask your health care provider if you are at risk.  If you are at risk of being infected with HIV, it is recommended that you take a prescription medicine daily to prevent HIV infection. This is called pre-exposure prophylaxis (PrEP). You are considered at risk if:  You are a man who has sex with other men (MSM).  You are a heterosexual man who is sexually active with multiple partners.  You take drugs by injection.  You are sexually active with a partner who has HIV.  Talk with your health care provider about whether you are at high risk of being infected with HIV. If you choose to begin PrEP, you should  first be tested for HIV. You should then be tested every 3 months for as long as you are taking PrEP.  Use sunscreen. Apply sunscreen liberally and repeatedly throughout the day. You should seek shade when your shadow is shorter than you. Protect yourself by wearing long sleeves, pants, a wide-brimmed hat, and sunglasses year round whenever you are outdoors.  Tell your health care provider of new moles or changes in moles, especially if there is a change in shape or color. Also, tell your health care provider if a mole is larger than the size of a pencil eraser.  A one-time screening for abdominal aortic aneurysm (AAA) and surgical repair of large AAAs by ultrasound is recommended for men aged 13-75 years who are current or former smokers.  Stay current with your vaccines (immunizations). This information is not intended to replace advice given to you by your health care provider. Make sure you discuss any questions you have with your health care provider. Document Released: 11/05/2007 Document Revised: 05/30/2014 Document Reviewed: 02/10/2015 Elsevier Interactive Patient Education  2017 [employer].

## 2016-04-25 NOTE — Assessment & Plan Note (Signed)
Taking proscar and flomax with good relief.

## 2016-04-25 NOTE — Progress Notes (Signed)
   Subjective:    Patient ID: Richard Cannon, male    DOB: 1934-10-03, 81 y.o.   MRN: [id number]  HPI Here for medicare wellness and physical, no new complaints. Please see A/P for status and treatment of chronic medical problems.   Diet: heart healthy Physical activity: active Depression/mood screen: negative Hearing: intact to whispered voice, mild loss Visual acuity: grossly normal with lens, performs annual eye exam  ADLs: capable Fall risk: none Home safety: good Cognitive evaluation: intact to orientation, naming, recall and repetition EOL planning: adv directives discussed  I have personally reviewed and have noted 1. The patient's medical and social history - reviewed today no changes 2. Their use of alcohol, tobacco or illicit drugs 3. Their current medications and supplements 4. The patient's functional ability including ADL's, fall risks, home safety risks and hearing or visual impairment. 5. Diet and physical activities 6. Evidence for depression or mood disorders 7. Care team reviewed and updated (available in snapshot)  Review of Systems  Constitutional: Negative.   HENT: Negative.   Eyes: Negative.   Respiratory: Negative for cough, chest tightness and shortness of breath.   Cardiovascular: Negative for chest pain, palpitations and leg swelling.  Gastrointestinal: Negative for abdominal distention, abdominal pain, constipation, diarrhea, nausea and vomiting.  Musculoskeletal: Positive for arthralgias.  Skin: Negative.   Neurological: Negative.   Psychiatric/Behavioral: Negative.       Objective:   Physical Exam  Constitutional: He is oriented to person, place, and time. He appears well-developed and well-nourished.  HENT:  Head: Normocephalic and atraumatic.  Eyes: EOM are normal.  lens  Neck: Normal range of motion.  Cardiovascular: Normal rate and regular rhythm.   Pulmonary/Chest: Effort normal and breath sounds normal. No respiratory distress. He  has no wheezes. He has no rales.  Abdominal: Soft. Bowel sounds are normal. He exhibits no distension. There is no tenderness. There is no rebound.  Musculoskeletal: He exhibits no edema.  Neurological: He is alert and oriented to person, place, and time. Coordination normal.  Skin: Skin is warm and dry.  Several cysts on the left forearm which are non-tender to palpation  Psychiatric: He has a normal mood and affect.   Vitals:   04/25/16 1301  BP: 140/80  Pulse: (!) 55  Resp: 18  Temp: 98.9 F (37.2 C)  TempSrc: Oral  SpO2: 96%  Weight: 196 lb (88.9 kg)  Height: 5\' 11"  (1.803 m)      Assessment & Plan:  Prevnar 13 given at visit.

## 2016-04-25 NOTE — Assessment & Plan Note (Signed)
Checking labs, given prevnar 13 today to complete pneumonia series. Tetanus up to date. Got flu shot already this season. Declines shingles. Aged out of colonoscopy (last within 10 years). Counseled on sun safety and mole surveillance. Given 10 year screening recommendations.

## 2016-04-25 NOTE — Progress Notes (Signed)
Pre visit review using our clinic review tool, if applicable. No additional management support is needed unless otherwise documented below in the visit note. 

## 2016-07-28 DIAGNOSIS — H401131 Primary open-angle glaucoma, bilateral, mild stage: Secondary | ICD-10-CM | POA: Diagnosis not present

## 2016-07-28 DIAGNOSIS — H2513 Age-related nuclear cataract, bilateral: Secondary | ICD-10-CM | POA: Diagnosis not present

## 2016-07-28 DIAGNOSIS — H524 Presbyopia: Secondary | ICD-10-CM | POA: Diagnosis not present

## 2016-09-08 DIAGNOSIS — H401131 Primary open-angle glaucoma, bilateral, mild stage: Secondary | ICD-10-CM | POA: Diagnosis not present

## 2016-09-14 DIAGNOSIS — D225 Melanocytic nevi of trunk: Secondary | ICD-10-CM | POA: Diagnosis not present

## 2016-09-14 DIAGNOSIS — X32XXXD Exposure to sunlight, subsequent encounter: Secondary | ICD-10-CM | POA: Diagnosis not present

## 2016-09-14 DIAGNOSIS — L570 Actinic keratosis: Secondary | ICD-10-CM | POA: Diagnosis not present

## 2016-09-21 DIAGNOSIS — R351 Nocturia: Secondary | ICD-10-CM | POA: Diagnosis not present

## 2016-09-21 DIAGNOSIS — N401 Enlarged prostate with lower urinary tract symptoms: Secondary | ICD-10-CM | POA: Diagnosis not present

## 2016-09-21 DIAGNOSIS — N5201 Erectile dysfunction due to arterial insufficiency: Secondary | ICD-10-CM | POA: Diagnosis not present

## 2016-09-21 DIAGNOSIS — R3121 Asymptomatic microscopic hematuria: Secondary | ICD-10-CM | POA: Diagnosis not present

## 2016-09-28 DIAGNOSIS — R319 Hematuria, unspecified: Secondary | ICD-10-CM | POA: Diagnosis not present

## 2016-09-28 DIAGNOSIS — R3121 Asymptomatic microscopic hematuria: Secondary | ICD-10-CM | POA: Diagnosis not present

## 2017-03-14 DIAGNOSIS — L570 Actinic keratosis: Secondary | ICD-10-CM | POA: Diagnosis not present

## 2017-03-14 DIAGNOSIS — X32XXXD Exposure to sunlight, subsequent encounter: Secondary | ICD-10-CM | POA: Diagnosis not present

## 2017-03-15 DIAGNOSIS — H2513 Age-related nuclear cataract, bilateral: Secondary | ICD-10-CM | POA: Diagnosis not present

## 2017-03-15 DIAGNOSIS — H401131 Primary open-angle glaucoma, bilateral, mild stage: Secondary | ICD-10-CM | POA: Diagnosis not present

## 2017-05-01 ENCOUNTER — Ambulatory Visit: Payer: Medicare Other | Admitting: Internal Medicine

## 2017-05-05 NOTE — Progress Notes (Signed)
Subjective:   Richard Cannon is a 82 y.o. male who presents for Medicare Annual/Subsequent preventive examination.  Review of Systems:  No ROS.  Medicare Wellness Visit. Additional risk factors are reflected in the social history.  Cardiac Risk Factors include: advanced age (>4355men, 47>65 women);male gender Sleep patterns: gets up 3-4 times nightly to void and sleeps 6-7 hours nightly.  Up caring for his wife during the night. Patient naps when he can. Home Safety/Smoke Alarms: Feels safe in home. Smoke alarms in place.  Living environment; residence and Firearm Safety: 1-story house/ trailer, no firearms. Lives with wife, no needs for DME, good support system Seat Belt Safety/Bike Helmet: Wears seat belt.     Objective:    Vitals: BP (!) 144/76   Pulse (!) 54   Temp 97.9 F (36.6 C)   Resp 20   Ht 5\' 11"  (1.803 m)   Wt 195 lb (88.5 kg)   SpO2 98%   BMI 27.20 kg/m   Body mass index is 27.2 kg/m.  Advanced Directives 05/08/2017 10/05/2015 09/28/2015 07/02/2014  Does Patient Have a Medical Advance Directive? Yes Yes Yes Yes  Type of Estate agentAdvance Directive Healthcare Power of [place];Living will Healthcare Power of [place];Living will Healthcare Power of [place];Living will Living will;Healthcare Power of Attorney  Does patient want to make changes to medical advance directive? - No - Patient declined - -  Copy of Healthcare Power of Attorney in Chart? No - copy requested No - copy requested - -    Tobacco Social History   Tobacco Use  Smoking Status Never Smoker  Smokeless Tobacco Never Used     Counseling given: Not Answered  Past Medical History:  Diagnosis Date  . Acute pulmonary embolism (HCC)   . Benign prostatic hypertrophy   . Deviated septum   . Pneumonia of lower lobe of lung (HCC)    bilateral  . Respiratory failure with hypoxia [facility](HCC)    Past Surgical History:  Procedure Laterality Date  . hernia surgery  05/30/2014  . SEPTOPLASTY N/A 10/05/2015   Procedure: SEPTOPLASTY;  Surgeon: Newman PiesSu Teoh, MD;  Location: [facility];  Service: ENT;  Laterality: N/A;  . TURBINATE REDUCTION Bilateral 10/05/2015   Procedure: BILATERAL TURBINATE REDUCTION;  Surgeon: Newman PiesSu Teoh, MD;  Location: [facility];  Service: ENT;  Laterality: Bilateral;   Family History  Problem Relation Age of Onset  . Heart disease Mother   . Breast cancer Mother   . Skin cancer Brother    Social History   Socioeconomic History  . Marital status: Married    Spouse name: None  . Number of children: None  . Years of education: None  . Highest education level: None  Social Needs  . Financial resource strain: Not hard at all  . Food insecurity - worry: Never true  . Food insecurity - inability: Never true  . Transportation needs - medical: No  . Transportation needs - non-medical: No  Occupational History  . Occupation: Retired    Comment: Printing/Farming  Tobacco Use  . Smoking status: Never Smoker  . Smokeless tobacco: Never Used  Substance and Sexual Activity  . Alcohol use: No    Alcohol/week: 0.0 oz  . Drug use: No  . Sexual activity: Yes  Other Topics Concern  . 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    Outpatient Encounter Medications as of 05/08/2017  Medication Sig  . aspirin 325  MG tablet Take 325 mg by mouth daily. Half tablet daily  . finasteride (PROSCAR) 5 MG tablet Take 1 tablet by mouth daily.  Marland Kitchen latanoprost (XALATAN) 0.005 % ophthalmic solution   . Multiple Vitamin (MULTI-VITAMIN PO) Take 1 tablet by mouth daily.  . sildenafil (VIAGRA) 100 MG tablet Take 0.5-1 tablets (50-100 mg total) by mouth daily as needed for erectile dysfunction.  . tamsulosin (FLOMAX) 0.4 MG CAPS capsule Take 0.4 mg by mouth daily. PATIENT TAKES ONE IN THE MORNING AND ONE AT NIGHT ONE DAY AND TAKES ONE AT NIGHT THE NEXT DAY AND ALTERNATES   No facility-administered encounter medications on file  as of 05/08/2017.     Activities of Daily Living In your present state of health, do you have any difficulty performing the following activities: 05/08/2017  Hearing? N  Vision? N  Difficulty concentrating or making decisions? N  Walking or climbing stairs? N  Dressing or bathing? N  Doing errands, shopping? N  Preparing Food and eating ? N  Using the Toilet? N  In the past six months, have you accidently leaked urine? N  Do you have problems with loss of bowel control? N  Managing your Medications? N  Managing your Finances? N  Housekeeping or managing your Housekeeping? N  Some recent data might be hidden    Patient Care Team: Myrlene Broker, MD as PCP - General (Internal Medicine) Storm Frisk, MD as Consulting Physician (Pulmonary Disease)   Assessment:   This is a routine wellness examination for Richard Cannon. Physical assessment deferred to PCP.   Exercise Activities and Dietary recommendations Current Exercise Habits: The patient has a physically strenous job, but has no regular exercise apart from work.(has a ), Exercise limited by: orthopedic condition(s)  Diet (meal preparation, eat out, water intake, caffeinated beverages, dairy products, fruits and vegetables): in general, a "healthy" diet  , well balanced   Reviewed heart healthy diet, encouraged patient to increase daily water intake.   Goals    . Patient Stated     Maintain current health status. 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       Fall Risk Fall Risk  05/08/2017 04/25/2016  Falls in the past year? No No   Depression Screen PHQ 2/9 Scores 05/08/2017 04/25/2016  PHQ - 2 Score 1 0  PHQ- 9 Score 2 -    Cognitive Function MMSE - Mini Mental State Exam 05/08/2017  Orientation to time 5  Orientation to Place 5  Registration 3  Attention/ Calculation 4  Recall 2  Language- name 2 objects 2  Language- repeat 1    Language- follow 3 step command 3  Language- read & follow direction 1  Write a sentence 1  Copy design 1  Total score 28        Immunization History  Administered Date(s) Administered  . Influenza, High Dose Seasonal PF 03/11/2016  . Influenza-Unspecified 02/20/2014, 03/21/2017  . Pneumococcal Conjugate-13 04/25/2016  . Pneumococcal Polysaccharide-23 02/20/2014  . Tdap 05/23/2008   Screening Tests Health Maintenance  Topic Date Due  . TETANUS/TDAP  05/23/2018  . INFLUENZA VACCINE  Completed  . PNA vac Low Risk Adult  Completed      Plan:    Continue doing brain stimulating activities (puzzles, reading, adult coloring books, staying active) to keep memory sharp.   Continue to eat heart healthy diet (full of fruits, vegetables, whole grains, lean protein,  water--limit salt, fat, and sugar intake) and increase physical activity as tolerated.  I have personally reviewed and noted the following in the patient's chart:   . Medical and social history . Use of alcohol, tobacco or illicit drugs  . Current medications and supplements . Functional ability and status . Nutritional status . Physical activity . Advanced directives . List of other physicians . Vitals . Screenings to include cognitive, depression, and falls . Referrals and appointments  In addition, I have reviewed and discussed with patient certain preventive protocols, quality metrics, and best practice recommendations. A written personalized care plan for preventive services as well as general preventive health recommendations were provided to patient.     Wanda PlumpJill A Delana Manganello, RN  05/08/2017

## 2017-05-08 ENCOUNTER — Encounter: Payer: Self-pay | Admitting: Internal Medicine

## 2017-05-08 ENCOUNTER — Other Ambulatory Visit (INDEPENDENT_AMBULATORY_CARE_PROVIDER_SITE_OTHER): Payer: Medicare Other

## 2017-05-08 ENCOUNTER — Ambulatory Visit (INDEPENDENT_AMBULATORY_CARE_PROVIDER_SITE_OTHER): Payer: Medicare Other | Admitting: Internal Medicine

## 2017-05-08 VITALS — BP 144/76 | HR 54 | Temp 97.9°F | Resp 20 | Ht 71.0 in | Wt 195.0 lb

## 2017-05-08 DIAGNOSIS — N401 Enlarged prostate with lower urinary tract symptoms: Secondary | ICD-10-CM | POA: Diagnosis not present

## 2017-05-08 DIAGNOSIS — Z0000 Encounter for general adult medical examination without abnormal findings: Secondary | ICD-10-CM

## 2017-05-08 DIAGNOSIS — R350 Frequency of micturition: Secondary | ICD-10-CM | POA: Diagnosis not present

## 2017-05-08 LAB — COMPREHENSIVE METABOLIC PANEL
ALT: 14 U/L (ref 0–53)
AST: 19 U/L (ref 0–37)
Albumin: 3.9 g/dL (ref 3.5–5.2)
Alkaline Phosphatase: 70 U/L (ref 39–117)
BUN: 16 mg/dL (ref 6–23)
CHLORIDE: 105 meq/L (ref 96–112)
CO2: 26 meq/L (ref 19–32)
Calcium: 8.9 mg/dL (ref 8.4–10.5)
Creatinine, Ser: 0.87 mg/dL (ref 0.40–1.50)
GFR: 89.69 mL/min (ref >60.00)
Glucose, Bld: 121 mg/dL — ABNORMAL HIGH (ref 70–99)
POTASSIUM: 3.9 meq/L (ref 3.5–5.1)
SODIUM: 138 meq/L (ref 135–145)
Total Bilirubin: 0.8 mg/dL (ref 0.2–1.2)
Total Protein: 6.5 g/dL (ref 6.0–8.3)

## 2017-05-08 LAB — LIPID PANEL
CHOL/HDL RATIO: 4
Cholesterol: 164 mg/dL (ref 0–200)
HDL: 39.5 mg/dL (ref >39.00)
LDL CALC: 90 mg/dL (ref 0–99)
NONHDL: 124.22
Triglycerides: 170 mg/dL — ABNORMAL HIGH (ref 0.0–149.0)
VLDL: 34 mg/dL (ref 0.0–40.0)

## 2017-05-08 LAB — CBC
HCT: 47.8 % (ref 39.0–52.0)
Hemoglobin: 16.3 g/dL (ref 13.0–17.0)
MCHC: 34 g/dL (ref 30.0–36.0)
MCV: 98.2 fl (ref 78.0–100.0)
PLATELETS: 240 10*3/uL (ref 150.0–400.0)
RBC: 4.87 Mil/uL (ref 4.22–5.81)
RDW: 13.6 % (ref 11.5–15.5)
WBC: 5.4 10*3/uL (ref 4.0–10.5)

## 2017-05-08 MED ORDER — ZOSTER VAC RECOMB ADJUVANTED 50 MCG/0.5ML IM SUSR: 0.5000 mL | Freq: Once | INTRAMUSCULAR | 1 refills | Status: AC

## 2017-05-08 NOTE — Assessment & Plan Note (Signed)
BP at goal, flu shot up to date. Pneumonia and tetanus up to date. Colonoscopy aged out. Counseled about sun safety and mole surveillance. Given 10 year screening recommendations.

## 2017-05-08 NOTE — Progress Notes (Signed)
   Subjective:    Patient ID: Richard Cannon, male    DOB: Apr 04, 1935, 82 y.o.   MRN: [id number]  HPI The patient is an 82 YO man coming in for physical.  [facility], social history reviewed and updated.   Review of Systems  Constitutional: Negative.   HENT: Negative.   Eyes: Negative.   Respiratory: Negative for cough, chest tightness and shortness of breath.   Cardiovascular: Negative for chest pain, palpitations and leg swelling.  Gastrointestinal: Negative for abdominal distention, abdominal pain, constipation, diarrhea, nausea and vomiting.  Musculoskeletal: Negative.   Skin: Negative.   Neurological: Negative.   Psychiatric/Behavioral: Negative.       Objective:   Physical Exam  Constitutional: He is oriented to person, place, and time. He appears well-developed and well-nourished.  HENT:  Head: Normocephalic and atraumatic.  Eyes: EOM are normal.  Neck: Normal range of motion.  Cardiovascular: Normal rate and regular rhythm.  Pulmonary/Chest: Effort normal and breath sounds normal. No respiratory distress. He has no wheezes. He has no rales.  Abdominal: Soft. Bowel sounds are normal. He exhibits no distension. There is no tenderness. There is no rebound.  Musculoskeletal: He exhibits no edema.  Neurological: He is alert and oriented to person, place, and time. Coordination normal.  Skin: Skin is warm and dry.  Psychiatric: He has a normal mood and affect.   Vitals:   05/08/17 1312  BP: (!) 144/76  Pulse: (!) 54  Resp: 20  Temp: 97.9 F (36.6 C)  SpO2: 98%  Weight: 195 lb (88.5 kg)  Height: 5\' 11"  (1.803 m)      Assessment & Plan:

## 2017-05-08 NOTE — Progress Notes (Signed)
Patient ID: Richard SailsRichard F Specht, male   DOB: [birth year], 82 y.o.   MRN: [id number] Medical screening examination/treatment/procedure(s) were performed by non-physician practitioner and as supervising physician I was immediately available for consultation/collaboration. I agree with above. Myrlene BrokerElizabeth A Crawford, MD

## 2017-05-08 NOTE — Assessment & Plan Note (Signed)
Still taking proscar and flomax. Symptoms controlled.

## 2017-05-08 NOTE — Patient Instructions (Addendum)
Continue doing brain stimulating activities (puzzles, reading, adult coloring books, staying active) to keep memory sharp.   Continue to eat heart healthy diet (full of fruits, vegetables, whole grains, lean protein, water--limit salt, fat, and sugar intake) and increase physical activity as tolerated.   Mr. Richard Cannon , Thank you for taking time to come for your Medicare Wellness Visit. I appreciate your ongoing commitment to your health goals. Please review the following plan we discussed and let me know if I can assist you in the future.   These are the goals we discussed: Goals    . Patient Stated     Maintain current health status. 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       This is a list of the screening recommended for you and due dates:  Health Maintenance  Topic Date Due  . Tetanus Vaccine  05/23/2018  . Flu Shot  Completed  . Pneumonia vaccines  Completed    Health Maintenance, Male A healthy lifestyle and preventive care is important for your health and wellness. Ask your health care provider about what schedule of regular examinations is right for you. What should I know about weight and diet? Eat a Healthy Diet  Eat plenty of vegetables, fruits, whole grains, low-fat dairy products, and lean protein.  Do not eat a lot of foods high in solid fats, added sugars, or salt.  Maintain a Healthy Weight Regular exercise can help you achieve or maintain a healthy weight. You should:  Do at least 150 minutes of exercise each week. The exercise should increase your heart rate and make you sweat (moderate-intensity exercise).  Do strength-training exercises at least twice a week.  Watch Your Levels of Cholesterol and Blood Lipids  Have your blood tested for lipids and cholesterol every 5 years starting at 80 years of age. If you are at high risk for heart disease, you should start having your blood  tested when you are 80 years old. You may need to have your cholesterol levels checked more often if: ? Your lipid or cholesterol levels are high. ? You are older than 80 years of age. ? You are at high risk for heart disease.  What should I know about cancer screening? Many types of cancers can be detected early and may often be prevented. Lung Cancer  You should be screened every year for lung cancer if: ? You are a current smoker who has smoked for at least 30 years. ? You are a former smoker who has quit within the past 15 years.  Talk to your health care provider about your screening options, when you should start screening, and how often you should be screened.  Colorectal Cancer  Routine colorectal cancer screening usually begins at 80 years of age and should be repeated every 5-10 years until you are 80 years old. You may need to be screened more often if early forms of precancerous polyps or small growths are found. Your health care provider may recommend screening at an earlier age if you have risk factors for colon cancer.  Your health care provider may recommend using home test kits to check for hidden blood in the stool.  A small camera at the end of a tube can be used to examine your colon (sigmoidoscopy or colonoscopy). This checks for the earliest forms of colorectal cancer.  Prostate and Testicular Cancer  Depending on your age and  overall health, your health care provider may do certain tests to screen for prostate and testicular cancer.  Talk to your health care provider about any symptoms or concerns you have about testicular or prostate cancer.  Skin Cancer  Check your skin from head to toe regularly.  Tell your health care provider about any new moles or changes in moles, especially if: ? There is a change in a mole's size, shape, or color. ? You have a mole that is larger than a pencil eraser.  Always use sunscreen. Apply sunscreen liberally and repeat  throughout the day.  Protect yourself by wearing long sleeves, pants, a wide-brimmed hat, and sunglasses when outside.  What should I know about heart disease, diabetes, and high blood pressure?  If you are 4318-80 years of age, have your blood pressure checked every 3-5 years. If you are 80 years of age or older, have your blood pressure checked every year. You should have your blood pressure measured twice-once when you are at a hospital or clinic, and once when you are not at a hospital or clinic. Record the average of the two measurements. To check your blood pressure when you are not at a hospital or clinic, you can use: ? An automated blood pressure machine at a pharmacy. ? A home blood pressure monitor.  Talk to your health care provider about your target blood pressure.  If you are between 6845-80 years old, ask your health care provider if you should take aspirin to prevent heart disease.  Have regular diabetes screenings by checking your fasting blood sugar level. ? If you are at a normal weight and have a low risk for diabetes, have this test once every three years after the age of 80. ? If you are overweight and have a high risk for diabetes, consider being tested at a younger age or more often.  A one-time screening for abdominal aortic aneurysm (AAA) by ultrasound is recommended for men aged 65-75 years who are current or former smokers. What should I know about preventing infection? Hepatitis B If you have a higher risk for hepatitis B, you should be screened for this virus. Talk with your health care provider to find out if you are at risk for hepatitis B infection. Hepatitis C Blood testing is recommended for:  Everyone born from [id number] through 1963.  Anyone with known risk factors for hepatitis C.  Sexually Transmitted Diseases (STDs)  You should be screened each year for STDs including gonorrhea and chlamydia if: ? You are sexually active and are younger than 80 years of  age. ? You are older than 80 years of age and your health care provider tells you that you are at risk for this type of infection. ? Your sexual activity has changed since you were last screened and you are at an increased risk for chlamydia or gonorrhea. Ask your health care provider if you are at risk.  Talk with your health care provider about whether you are at high risk of being infected with HIV. Your health care provider may recommend a prescription medicine to help prevent HIV infection.  What else can I do?  Schedule regular health, dental, and eye exams.  Stay current with your vaccines (immunizations).  Do not use any tobacco products, such as cigarettes, chewing tobacco, and e-cigarettes. If you need help quitting, ask your health care provider.  Limit alcohol intake to no more than 2 drinks per day. One drink equals 12 ounces of beer,  5 ounces of Valisha Heslin, or 1 ounces of hard liquor.  Do not use street drugs.  Do not share needles.  Ask your health care provider for help if you need support or information about quitting drugs.  Tell your health care provider if you often feel depressed.  Tell your health care provider if you have ever been abused or do not feel safe at home. This information is not intended to replace advice given to you by your health care provider. Make sure you discuss any questions you have with your health care provider. Document Released: 11/05/2007 Document Revised: 01/06/2016 Document Reviewed: 02/10/2015 Elsevier Interactive Patient Education  [employer].

## 2017-09-07 ENCOUNTER — Ambulatory Visit (INDEPENDENT_AMBULATORY_CARE_PROVIDER_SITE_OTHER): Payer: Medicare Other | Admitting: Otolaryngology

## 2017-09-07 DIAGNOSIS — H903 Sensorineural hearing loss, bilateral: Secondary | ICD-10-CM

## 2017-09-07 DIAGNOSIS — H6981 Other specified disorders of Eustachian tube, right ear: Secondary | ICD-10-CM

## 2017-09-07 DIAGNOSIS — J310 Chronic rhinitis: Secondary | ICD-10-CM

## 2017-09-07 DIAGNOSIS — H9011 Conductive hearing loss, unilateral, right ear, with unrestricted hearing on the contralateral side: Secondary | ICD-10-CM | POA: Diagnosis not present

## 2017-09-12 DIAGNOSIS — C44319 Basal cell carcinoma of skin of other parts of face: Secondary | ICD-10-CM | POA: Diagnosis not present

## 2017-09-12 DIAGNOSIS — X32XXXD Exposure to sunlight, subsequent encounter: Secondary | ICD-10-CM | POA: Diagnosis not present

## 2017-09-12 DIAGNOSIS — D225 Melanocytic nevi of trunk: Secondary | ICD-10-CM | POA: Diagnosis not present

## 2017-09-12 DIAGNOSIS — L570 Actinic keratosis: Secondary | ICD-10-CM | POA: Diagnosis not present

## 2017-09-14 DIAGNOSIS — H401131 Primary open-angle glaucoma, bilateral, mild stage: Secondary | ICD-10-CM | POA: Diagnosis not present

## 2017-10-11 DIAGNOSIS — N5201 Erectile dysfunction due to arterial insufficiency: Secondary | ICD-10-CM | POA: Diagnosis not present

## 2017-10-11 DIAGNOSIS — N401 Enlarged prostate with lower urinary tract symptoms: Secondary | ICD-10-CM | POA: Diagnosis not present

## 2017-10-11 DIAGNOSIS — R351 Nocturia: Secondary | ICD-10-CM | POA: Diagnosis not present

## 2017-10-30 ENCOUNTER — Ambulatory Visit (INDEPENDENT_AMBULATORY_CARE_PROVIDER_SITE_OTHER): Payer: Medicare Other | Admitting: Otolaryngology

## 2017-10-30 DIAGNOSIS — H6983 Other specified disorders of Eustachian tube, bilateral: Secondary | ICD-10-CM | POA: Diagnosis not present

## 2017-10-30 DIAGNOSIS — H903 Sensorineural hearing loss, bilateral: Secondary | ICD-10-CM

## 2017-10-31 DIAGNOSIS — X32XXXD Exposure to sunlight, subsequent encounter: Secondary | ICD-10-CM | POA: Diagnosis not present

## 2017-10-31 DIAGNOSIS — Z85828 Personal history of other malignant neoplasm of skin: Secondary | ICD-10-CM | POA: Diagnosis not present

## 2017-10-31 DIAGNOSIS — L570 Actinic keratosis: Secondary | ICD-10-CM | POA: Diagnosis not present

## 2017-10-31 DIAGNOSIS — Z08 Encounter for follow-up examination after completed treatment for malignant neoplasm: Secondary | ICD-10-CM | POA: Diagnosis not present

## 2018-01-01 ENCOUNTER — Ambulatory Visit (INDEPENDENT_AMBULATORY_CARE_PROVIDER_SITE_OTHER): Payer: Medicare Other | Admitting: Otolaryngology

## 2018-01-01 DIAGNOSIS — H903 Sensorineural hearing loss, bilateral: Secondary | ICD-10-CM

## 2018-01-01 DIAGNOSIS — H6983 Other specified disorders of Eustachian tube, bilateral: Secondary | ICD-10-CM | POA: Diagnosis not present

## 2018-03-13 DIAGNOSIS — Z85828 Personal history of other malignant neoplasm of skin: Secondary | ICD-10-CM | POA: Diagnosis not present

## 2018-03-13 DIAGNOSIS — L570 Actinic keratosis: Secondary | ICD-10-CM | POA: Diagnosis not present

## 2018-03-13 DIAGNOSIS — Z08 Encounter for follow-up examination after completed treatment for malignant neoplasm: Secondary | ICD-10-CM | POA: Diagnosis not present

## 2018-03-13 DIAGNOSIS — D225 Melanocytic nevi of trunk: Secondary | ICD-10-CM | POA: Diagnosis not present

## 2018-03-16 DIAGNOSIS — H401131 Primary open-angle glaucoma, bilateral, mild stage: Secondary | ICD-10-CM | POA: Diagnosis not present

## 2018-05-09 NOTE — Progress Notes (Signed)
Subjective:   Richard Cannon is a 83 y.o. male who presents for Medicare Annual/Subsequent preventive examination.  Review of Systems:  No ROS.  Medicare Wellness Visit. Additional risk factors are reflected in the social history.  Cardiac Risk Factors include: advanced age (>66men, >51 women);male gender Sleep patterns: has interrupted sleep, gets up 2 times nightly to void and sleeps 7 hours nightly.    Home Safety/Smoke Alarms: Feels safe in home. Smoke alarms in place.  Living environment; residence and Firearm Safety: 1-story house/ trailer, no firearms. Lives with wife, no needs for DME, good support system Seat Belt Safety/Bike Helmet: Wears seat belt.     Objective:    Vitals: BP 134/74   Pulse 60   Resp 17   Ht 5\' 11"  (1.803 m)   Wt 193 lb (87.5 kg)   SpO2 97%   BMI 26.92 kg/m   Body mass index is 26.92 kg/m.  Advanced Directives 05/10/2018 05/08/2017 10/05/2015 09/28/2015 07/02/2014  Does Patient Have a Medical Advance Directive? Yes Yes Yes Yes Yes  Type of Estate agent of [place];Living will Healthcare Power of Richard Cannon;Living will Healthcare Power of Richard Cannon;Living will Healthcare Power of Richard Cannon;Living will Living will;Healthcare Power of Attorney  Does patient want to make changes to medical advance directive? - - No - Patient declined - -  Copy of Healthcare Power of Attorney in Chart? No - copy requested No - copy requested No - copy requested - -    Tobacco Social History   Tobacco Use  Smoking Status Never Smoker  Smokeless Tobacco Never Used     Counseling given: Not Answered  Past Medical History:  Diagnosis Date  . Acute pulmonary embolism (HCC)   . Benign prostatic hypertrophy   . Deviated septum   . Pneumonia of lower lobe of lung (HCC)    bilateral  . Respiratory failure with hypoxia [facility])    Past Surgical History:  Procedure Laterality Date  . hernia surgery  05/30/2014  . SEPTOPLASTY N/A 10/05/2015   Procedure: SEPTOPLASTY;  Surgeon: Richard Pies, MD;  Location: [facility];  Service: ENT;  Laterality: N/A;  . TURBINATE REDUCTION Bilateral 10/05/2015   Procedure: BILATERAL TURBINATE REDUCTION;  Surgeon: Richard Pies, MD;  Location: [facility];  Service: ENT;  Laterality: Bilateral;   Family History  Problem Relation Age of Onset  . Heart disease Mother   . Breast cancer Mother   . Skin cancer Brother    Social History   Socioeconomic History  . Marital status: Married    Spouse name: Not on file  . Number of children: 3  . Years of education: Not on file  . Highest education level: Not on file  Occupational History  . Occupation: Retired    Comment: Printing/Farming  Social Needs  . Financial resource strain: Not hard at all  . Food insecurity:    Worry: Never true    Inability: Never true  . Transportation needs:    Medical: No    Non-medical: No  Tobacco Use  . Smoking status: Never Smoker  . Smokeless tobacco: Never Used  Substance and Sexual Activity  . Alcohol use: No    Alcohol/week: 0.0 standard drinks  . Drug use: No  . Sexual activity: Yes  Lifestyle  . Physical activity:    Days per week: 7 days    Minutes per session: 80 min  . Stress: To some extent  Relationships  . Social connections:  Talks on phone: More than three times a week    Gets together: More than three times a week    Attends religious service: More than 4 times per year    Active member of club or organization: Yes    Attends meetings of clubs or organizations: More than 4 times per year    Relationship status: Married  Other Topics Concern  . 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    Outpatient Encounter Medications as of 05/10/2018  Medication Sig  . aspirin EC 81 MG tablet Take 81 mg by mouth daily.  . finasteride (PROSCAR) 5 MG tablet Take 1 tablet by mouth daily.  Marland Kitchen latanoprost (XALATAN) 0.005 %  ophthalmic solution   . Multiple Vitamin (MULTI-VITAMIN PO) Take 1 tablet by mouth daily.  . sildenafil (VIAGRA) 100 MG tablet Take 0.5-1 tablets (50-100 mg total) by mouth daily as needed for erectile dysfunction.  . tamsulosin (FLOMAX) 0.4 MG CAPS capsule Take 0.4 mg by mouth as needed. PATIENT TAKES ONE IN THE MORNING AND ONE AT NIGHT ONE DAY AND TAKES ONE AT NIGHT THE NEXT DAY AND ALTERNATES  . [DISCONTINUED] aspirin 325 MG tablet Take 81 mg by mouth daily. Half tablet daily    No facility-administered encounter medications on file as of 05/10/2018.     Activities of Daily Living In your present state of health, do you have any difficulty performing the following activities: 05/10/2018  Hearing? N  Vision? N  Difficulty concentrating or making decisions? N  Walking or climbing stairs? N  Dressing or bathing? N  Doing errands, shopping? N  Preparing Food and eating ? N  Using the Toilet? N  In the past six months, have you accidently leaked urine? N  Do you have problems with loss of bowel control? N  Managing your Medications? N  Managing your Finances? N  Housekeeping or managing your Housekeeping? N  Some recent data might be hidden    Patient Care Team: Myrlene Broker, MD as PCP - General (Internal Medicine) Storm Frisk, MD as Consulting Physician (Pulmonary Disease)   Assessment:   This is a routine wellness examination for Richard Cannon. Physical assessment deferred to PCP.   Exercise Activities and Dietary recommendations Current Exercise Habits: Home exercise routine;Structured exercise class, Type of exercise: walking;strength training/weights;calisthenics, Time (Minutes): 50, Frequency (Times/Week): 5, Weekly Exercise (Minutes/Week): 250, Intensity: Mild, Exercise limited by: None identified  Diet (meal preparation, eat out, water intake, caffeinated beverages, dairy products, fruits and vegetables): in general, a "healthy" diet  , well balanced.  Reviewed  heart healthy diet. Encouraged patient to increase daily water and healthy fluid intake.  Goals    . Patient Stated     Maintain current health status. 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    . Patient Stated     Increase the amount of time individuals come to assist me in my home to twice a week to give me more me time to relax and enjoy my hobbies.        Fall Risk Fall Risk  05/10/2018 05/08/2017 04/25/2016  Falls in the past year? 0 No No   Depression Screen PHQ 2/9 Scores 05/10/2018 05/08/2017 04/25/2016  PHQ - 2 Score 0 1 0  PHQ- 9 Score - 2 -    Cognitive Function MMSE - Mini Mental State Exam  05/10/2018 05/08/2017  Orientation to time 5 5  Orientation to Place 5 5  Registration 3 3  Attention/ Calculation 5 4  Recall 1 2  Language- name 2 objects 2 2  Language- repeat 1 1  Language- follow 3 step command 3 3  Language- read & follow direction 1 1  Write a sentence 1 1  Copy design 1 1  Total score 28 28        Immunization History  Administered Date(s) Administered  . Influenza, High Dose Seasonal PF 03/11/2016, 03/23/2018  . Influenza-Unspecified 02/20/2014, 03/21/2017  . Pneumococcal Conjugate-13 04/25/2016  . Pneumococcal Polysaccharide-23 02/20/2014  . Tdap 05/23/2008   Screening Tests Health Maintenance  Topic Date Due  . TETANUS/TDAP  05/23/2018  . INFLUENZA VACCINE  Completed  . PNA vac Low Risk Adult  Completed       Plan:    Continue doing brain stimulating activities (puzzles, reading, adult coloring books, staying active) to keep memory sharp.   Continue to eat heart healthy diet (full of fruits, vegetables, whole grains, lean protein, water--limit salt, fat, and sugar intake) and increase physical activity as tolerated.  I have personally reviewed and noted the following in the patient's chart:   . Medical and social history . Use of alcohol, tobacco or  illicit drugs  . Current medications and supplements . Functional ability and status . Nutritional status . Physical activity . Advanced directives . List of other physicians . months . Vitals . Screenings to include cognitive, depression, and falls . Referrals and appointments  In addition, I have reviewed and discussed with patient certain preventive protocols, quality metrics, and best practice recommendations. A written personalized care plan for preventive services as well as general preventive health recommendations were provided to patient.     Wanda PlumpJill A Wine, RN  05/10/2018

## 2018-05-10 ENCOUNTER — Ambulatory Visit (INDEPENDENT_AMBULATORY_CARE_PROVIDER_SITE_OTHER): Payer: Medicare Other | Admitting: *Deleted

## 2018-05-10 VITALS — BP 134/74 | HR 60 | Resp 17 | Ht 71.0 in | Wt 193.0 lb

## 2018-05-10 DIAGNOSIS — Z0000 Encounter for general adult medical examination without abnormal findings: Secondary | ICD-10-CM

## 2018-05-10 NOTE — Progress Notes (Signed)
Medical screening examination/treatment/procedure(s) were performed by non-physician practitioner and as supervising physician I was immediately available for consultation/collaboration. I agree with above. Miakoda Mcmillion A Julionna Marczak, MD 

## 2018-05-10 NOTE — Patient Instructions (Addendum)
Continue doing brain stimulating activities (puzzles, reading, adult coloring books, staying active) to keep memory sharp.   Continue to eat heart healthy diet (full of fruits, vegetables, whole grains, lean protein, water--limit salt, fat, and sugar intake) and increase physical activity as tolerated.   Richard Cannon , Thank you for taking time to come for your Medicare Wellness Visit. I appreciate your ongoing commitment to your health goals. Please review the following plan we discussed and let me know if I can assist you in the future.   These are the goals we discussed: Goals    . Patient Stated     Maintain current health status. 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    . Patient Stated     Increase the amount of time individuals come to assist me in my home to twice a week to give me more me time to relax and enjoy my hobbies.        This is a list of the screening recommended for you and due dates:  Health Maintenance  Topic Date Due  . Tetanus Vaccine  05/23/2018  . Flu Shot  Completed  . Pneumonia vaccines  Completed    Health Maintenance, Male A healthy lifestyle and preventive care is important for your health and wellness. Ask your health care provider about what schedule of regular examinations is right for you. What should I know about weight and diet? Eat a Healthy Diet  Eat plenty of vegetables, fruits, whole grains, low-fat dairy products, and lean protein.  Do not eat a lot of foods high in solid fats, added sugars, or salt.  Maintain a Healthy Weight Regular exercise can help you achieve or maintain a healthy weight. You should:  Do at least 150 minutes of exercise each week. The exercise should increase your heart rate and make you sweat (moderate-intensity exercise).  Do strength-training exercises at least twice a week. Watch Your Levels of Cholesterol and Blood Lipids  Have  your blood tested for lipids and cholesterol every 5 years starting at 81 years of age. If you are at high risk for heart disease, you should start having your blood tested when you are 81 years old. You may need to have your cholesterol levels checked more often if: ? Your lipid or cholesterol levels are high. ? You are older than 81 years of age. ? You are at high risk for heart disease. What should I know about cancer screening? Many types of cancers can be detected early and may often be prevented. Lung Cancer  You should be screened every year for lung cancer if: ? You are a current smoker who has smoked for at least 30 years. ? You are a former smoker who has quit within the past 15 years.  Talk to your health care provider about your screening options, when you should start screening, and how often you should be screened. Colorectal Cancer  Routine colorectal cancer screening usually begins at 81 years of age and should be repeated every 5-10 years until you are 81 years old. You may need to be screened more often if early forms of precancerous polyps or small growths are found. Your health care provider may recommend screening at an earlier age if you have risk factors for colon cancer.  Your health care provider may recommend using home test kits to check for hidden blood in the stool.  A small  camera at the end of a tube can be used to examine your colon (sigmoidoscopy or colonoscopy). This checks for the earliest forms of colorectal cancer. Prostate and Testicular Cancer  Depending on your age and overall health, your health care provider may do certain tests to screen for prostate and testicular cancer.  Talk to your health care provider about any symptoms or concerns you have about testicular or prostate cancer. Skin Cancer  Check your skin from head to toe regularly.  Tell your health care provider about any new moles or changes in moles, especially if: ? There is a change  in a mole's size, shape, or color. ? You have a mole that is larger than a pencil eraser.  Always use sunscreen. Apply sunscreen liberally and repeat throughout the day.  Protect yourself by wearing long sleeves, pants, a wide-brimmed hat, and sunglasses when outside. What should I know about heart disease, diabetes, and high blood pressure?  If you are 7618-81 years of age, have your blood pressure checked every 3-5 years. If you are 81 years of age or older, have your blood pressure checked every year. You should have your blood pressure measured twice-once when you are at a hospital or clinic, and once when you are not at a hospital or clinic. Record the average of the two measurements. To check your blood pressure when you are not at a hospital or clinic, you can use: ? An automated blood pressure machine at a pharmacy. ? A home blood pressure monitor.  Talk to your health care provider about your target blood pressure.  If you are between 5845-81 years old, ask your health care provider if you should take aspirin to prevent heart disease.  Have regular diabetes screenings by checking your fasting blood sugar level. ? If you are at a normal weight and have a low risk for diabetes, have this test once every three years after the age of 81. ? If you are overweight and have a high risk for diabetes, consider being tested at a younger age or more often.  A one-time screening for abdominal aortic aneurysm (AAA) by ultrasound is recommended for men aged 65-75 years who are current or former smokers. What should I know about preventing infection? Hepatitis B If you have a higher risk for hepatitis B, you should be screened for this virus. Talk with your health care provider to find out if you are at risk for hepatitis B infection. Hepatitis C Blood testing is recommended for:  Everyone born from [birth year] through 1963.  Anyone with known risk factors for hepatitis C. Sexually Transmitted Diseases  (STDs)  You should be screened each year for STDs including gonorrhea and chlamydia if: ? You are sexually active and are younger than 81 years of age. ? You are older than 81 years of age and your health care provider tells you that you are at risk for this type of infection. ? Your sexual activity has changed since you were last screened and you are at an increased risk for chlamydia or gonorrhea. Ask your health care provider if you are at risk.  Talk with your health care provider about whether you are at high risk of being infected with HIV. Your health care provider may recommend a prescription medicine to help prevent HIV infection. What else can I do?  Schedule regular health, dental, and eye exams.  Stay current with your vaccines (immunizations).  Do not use any tobacco products, such as cigarettes, chewing  tobacco, and e-cigarettes. If you need help quitting, ask your health care provider.  Limit alcohol intake to no more than 2 drinks per day. One drink equals 12 ounces of beer, 5 ounces of Ashtian Villacis, or 1 ounces of hard liquor.  Do not use street drugs.  Do not share needles.  Ask your health care provider for help if you need support or information about quitting drugs.  Tell your health care provider if you often feel depressed.  Tell your health care provider if you have ever been abused or do not feel safe at home. This information is not intended to replace advice given to you by your health care provider. Make sure you discuss any questions you have with your health care provider. Document Released: 11/05/2007 Document Revised: 01/06/2016 Document Reviewed: 02/10/2015 Elsevier Interactive Patient Education  2019 [employer].

## 2018-06-12 ENCOUNTER — Other Ambulatory Visit (INDEPENDENT_AMBULATORY_CARE_PROVIDER_SITE_OTHER): Payer: Medicare Other

## 2018-06-12 ENCOUNTER — Encounter: Payer: Self-pay | Admitting: Internal Medicine

## 2018-06-12 ENCOUNTER — Ambulatory Visit: Payer: Medicare Other | Admitting: Internal Medicine

## 2018-06-12 VITALS — BP 132/70 | HR 67 | Temp 98.6°F | Ht 71.0 in | Wt 195.0 lb

## 2018-06-12 DIAGNOSIS — Z0000 Encounter for general adult medical examination without abnormal findings: Secondary | ICD-10-CM

## 2018-06-12 DIAGNOSIS — Z23 Encounter for immunization: Secondary | ICD-10-CM

## 2018-06-12 DIAGNOSIS — R350 Frequency of micturition: Secondary | ICD-10-CM

## 2018-06-12 DIAGNOSIS — N401 Enlarged prostate with lower urinary tract symptoms: Secondary | ICD-10-CM | POA: Diagnosis not present

## 2018-06-12 LAB — CBC
HCT: 46.5 % (ref 39.0–52.0)
HEMOGLOBIN: 16.2 g/dL (ref 13.0–17.0)
MCHC: 34.8 g/dL (ref 30.0–36.0)
MCV: 97.6 fl (ref 78.0–100.0)
Platelets: 267 10*3/uL (ref 150.0–400.0)
RBC: 4.76 Mil/uL (ref 4.22–5.81)
RDW: 13.8 % (ref 11.5–15.5)
WBC: 5.4 10*3/uL (ref 4.0–10.5)

## 2018-06-12 LAB — COMPREHENSIVE METABOLIC PANEL
ALT: 13 U/L (ref 0–53)
AST: 18 U/L (ref 0–37)
Albumin: 4 g/dL (ref 3.5–5.2)
Alkaline Phosphatase: 71 U/L (ref 39–117)
BILIRUBIN TOTAL: 0.4 mg/dL (ref 0.2–1.2)
BUN: 17 mg/dL (ref 6–23)
CALCIUM: 9.1 mg/dL (ref 8.4–10.5)
CHLORIDE: 106 meq/L (ref 96–112)
CO2: 28 meq/L (ref 19–32)
Creatinine, Ser: 0.97 mg/dL (ref 0.40–1.50)
GFR: 74.22 mL/min (ref >60.00)
Glucose, Bld: 99 mg/dL (ref 70–99)
Potassium: 4.9 mEq/L (ref 3.5–5.1)
Sodium: 140 mEq/L (ref 135–145)
Total Protein: 6.4 g/dL (ref 6.0–8.3)

## 2018-06-12 LAB — LIPID PANEL
Cholesterol: 187 mg/dL (ref 0–200)
HDL: 35.2 mg/dL — ABNORMAL LOW (ref >39.00)
Total CHOL/HDL Ratio: 5

## 2018-06-12 LAB — LDL CHOLESTEROL, DIRECT: Direct LDL: 115 mg/dL

## 2018-06-12 NOTE — Assessment & Plan Note (Signed)
Stable on proscar and seeing urology.

## 2018-06-12 NOTE — Patient Instructions (Signed)
Health Maintenance After Age 82 After age 82, you are at a higher risk for certain long-term diseases and infections as well as injuries from falls. Falls are a major cause of broken bones and head injuries in people who are older than age 82. Getting regular preventive care can help to keep you healthy and well. Preventive care includes getting regular testing and making lifestyle changes as recommended by your health care provider. Talk with your health care provider about:  Which screenings and tests you should have. A screening is a test that checks for a disease when you have no symptoms.  A diet and exercise plan that is right for you. What should I know about screenings and tests to prevent falls? Screening and testing are the best ways to find a health problem early. Early diagnosis and treatment give you the best chance of managing medical conditions that are common after age 82. Certain conditions and lifestyle choices may make you more likely to have a fall. Your health care provider may recommend:  Regular vision checks. Poor vision and conditions such as cataracts can make you more likely to have a fall. If you wear glasses, make sure to get your prescription updated if your vision changes.  Medicine review. Work with your health care provider to regularly review all of the medicines you are taking, including over-the-counter medicines. Ask your health care provider about any side effects that may make you more likely to have a fall. Tell your health care provider if any medicines that you take make you feel dizzy or sleepy.  Osteoporosis screening. Osteoporosis is a condition that causes the bones to get weaker. This can make the bones weak and cause them to break more easily.  Blood pressure screening. Blood pressure changes and medicines to control blood pressure can make you feel dizzy.  Strength and balance checks. Your health care provider may recommend certain tests to check your  strength and balance while standing, walking, or changing positions.  Foot health exam. Foot pain and numbness, as well as not wearing proper footwear, can make you more likely to have a fall.  Depression screening. You may be more likely to have a fall if you have a fear of falling, feel emotionally low, or feel unable to do activities that you used to do.  Alcohol use screening. Using too much alcohol can affect your balance and may make you more likely to have a fall. What actions can I take to lower my risk of falls? General instructions  Talk with your health care provider about your risks for falling. Tell your health care provider if: ? You fall. Be sure to tell your health care provider about all falls, even ones that seem minor. ? You feel dizzy, sleepy, or off-balance.  Take over-the-counter and prescription medicines only as told by your health care provider. These include any supplements.  Eat a healthy diet and maintain a healthy weight. A healthy diet includes low-fat dairy products, low-fat (lean) meats, and fiber from whole grains, beans, and lots of fruits and vegetables. Home safety  Remove any tripping hazards, such as rugs, cords, and clutter.  Install safety equipment such as grab bars in bathrooms and safety rails on stairs.  Keep rooms and walkways well-lit. Activity   Follow a regular exercise program to stay fit. This will help you maintain your balance. Ask your health care provider what types of exercise are appropriate for you.  If you need a cane or   walker, use it as recommended by your health care provider.  Wear supportive shoes that have nonskid soles. Lifestyle  Do not drink alcohol if your health care provider tells you not to drink.  If you drink alcohol, limit how much you have: ? 0-1 drink a day for women. ? 0-2 drinks a day for men.  Be aware of how much alcohol is in your drink. In the [place] one drink equals one typical bottle of beer (12  oz), one-half glass of wine (5 oz), or one shot of hard liquor (1 oz).  Do not use any products that contain nicotine or tobacco, such as cigarettes and e-cigarettes. If you need help quitting, ask your health care provider. Summary  Having a healthy lifestyle and getting preventive care can help to protect your health and wellness after age 82.  Screening and testing are the best way to find a health problem early and help you avoid having a fall. Early diagnosis and treatment give you the best chance for managing medical conditions that are more common for people who are older than age 82.  Falls are a major cause of broken bones and head injuries in people who are older than age 82. Take precautions to prevent a fall at home.  Work with your health care provider to learn what changes you can make to improve your health and wellness and to prevent falls. This information is not intended to replace advice given to you by your health care provider. Make sure you discuss any questions you have with your health care provider. Document Released: 03/22/2017 Document Revised: 03/22/2017 Document Reviewed: 03/22/2017 Elsevier Interactive Patient Education  2019 [employer] Inc[employer]  

## 2018-06-12 NOTE — Progress Notes (Signed)
   Subjective:   Patient ID: GISCARD NOUN, male    DOB: [birth year], 84 y.o.   MRN: [id number]  HPI The patient is an 84 YO man coming in for physical.   PMH, [facility], social history reviewed and updated.   Review of Systems  Constitutional: Negative.   HENT: Negative.   Eyes: Negative.   Respiratory: Negative for cough, chest tightness and shortness of breath.   Cardiovascular: Negative for chest pain, palpitations and leg swelling.  Gastrointestinal: Negative for abdominal distention, abdominal pain, constipation, diarrhea, nausea and vomiting.  Musculoskeletal: Negative.   Skin: Negative.   Neurological: Negative.   Psychiatric/Behavioral: Negative.     Objective:  Physical Exam Constitutional:      Appearance: He is well-developed.  HENT:     Head: Normocephalic and atraumatic.  Neck:     Musculoskeletal: Normal range of motion.  Cardiovascular:     Rate and Rhythm: Normal rate and regular rhythm.  Pulmonary:     Effort: Pulmonary effort is normal. No respiratory distress.     Breath sounds: Normal breath sounds. No wheezing or rales.  Abdominal:     General: Bowel sounds are normal. There is no distension.     Palpations: Abdomen is soft.     Tenderness: There is no abdominal tenderness. There is no rebound.  Skin:    General: Skin is warm and dry.  Neurological:     Mental Status: He is alert and oriented to person, place, and time.     Coordination: Coordination normal.     Vitals:   06/12/18 1333  BP: 132/70  Pulse: 67  Temp: 98.6 F (37 C)  TempSrc: Oral  SpO2: 97%  Weight: 195 lb (88.5 kg)  Height: 5\' 11"  (1.803 m)    Assessment & Plan:  Tdap given at visit

## 2018-06-12 NOTE — Assessment & Plan Note (Signed)
Flu shot up to date. Pneumonia complete. Shingrix complete. Tetanus given. Colonoscopy aged out. Counseled about sun safety and mole surveillance. Counseled about the dangers of distracted driving. Given 10 year screening recommendations.

## 2018-10-09 DIAGNOSIS — L570 Actinic keratosis: Secondary | ICD-10-CM | POA: Diagnosis not present

## 2018-10-09 DIAGNOSIS — D225 Melanocytic nevi of trunk: Secondary | ICD-10-CM | POA: Diagnosis not present

## 2018-10-09 DIAGNOSIS — X32XXXD Exposure to sunlight, subsequent encounter: Secondary | ICD-10-CM | POA: Diagnosis not present

## 2018-10-17 DIAGNOSIS — N401 Enlarged prostate with lower urinary tract symptoms: Secondary | ICD-10-CM | POA: Diagnosis not present

## 2018-10-17 DIAGNOSIS — N5201 Erectile dysfunction due to arterial insufficiency: Secondary | ICD-10-CM | POA: Diagnosis not present

## 2018-10-17 DIAGNOSIS — R351 Nocturia: Secondary | ICD-10-CM | POA: Diagnosis not present

## 2018-11-12 DIAGNOSIS — H2513 Age-related nuclear cataract, bilateral: Secondary | ICD-10-CM | POA: Diagnosis not present

## 2018-11-12 DIAGNOSIS — H401131 Primary open-angle glaucoma, bilateral, mild stage: Secondary | ICD-10-CM | POA: Diagnosis not present

## 2018-12-31 ENCOUNTER — Ambulatory Visit (INDEPENDENT_AMBULATORY_CARE_PROVIDER_SITE_OTHER): Payer: Medicare Other | Admitting: Otolaryngology

## 2019-02-11 ENCOUNTER — Ambulatory Visit (INDEPENDENT_AMBULATORY_CARE_PROVIDER_SITE_OTHER): Payer: Medicare Other | Admitting: Otolaryngology

## 2019-02-11 DIAGNOSIS — H6983 Other specified disorders of Eustachian tube, bilateral: Secondary | ICD-10-CM

## 2019-02-11 DIAGNOSIS — H903 Sensorineural hearing loss, bilateral: Secondary | ICD-10-CM | POA: Diagnosis not present

## 2019-05-15 DIAGNOSIS — H401131 Primary open-angle glaucoma, bilateral, mild stage: Secondary | ICD-10-CM | POA: Diagnosis not present

## 2019-06-04 DIAGNOSIS — L570 Actinic keratosis: Secondary | ICD-10-CM | POA: Diagnosis not present

## 2019-06-04 DIAGNOSIS — D225 Melanocytic nevi of trunk: Secondary | ICD-10-CM | POA: Diagnosis not present

## 2019-06-04 DIAGNOSIS — X32XXXD Exposure to sunlight, subsequent encounter: Secondary | ICD-10-CM | POA: Diagnosis not present

## 2019-06-14 ENCOUNTER — Encounter: Payer: Medicare Other | Admitting: Internal Medicine

## 2019-06-27 ENCOUNTER — Ambulatory Visit (INDEPENDENT_AMBULATORY_CARE_PROVIDER_SITE_OTHER): Payer: Medicare Other | Admitting: Internal Medicine

## 2019-06-27 ENCOUNTER — Encounter: Payer: Self-pay | Admitting: Internal Medicine

## 2019-06-27 ENCOUNTER — Other Ambulatory Visit: Payer: Self-pay

## 2019-06-27 VITALS — BP 136/84 | HR 56 | Temp 98.2°F | Ht 71.0 in | Wt 192.0 lb

## 2019-06-27 DIAGNOSIS — R350 Frequency of micturition: Secondary | ICD-10-CM | POA: Diagnosis not present

## 2019-06-27 DIAGNOSIS — Z0000 Encounter for general adult medical examination without abnormal findings: Secondary | ICD-10-CM

## 2019-06-27 DIAGNOSIS — N401 Enlarged prostate with lower urinary tract symptoms: Secondary | ICD-10-CM

## 2019-06-27 DIAGNOSIS — Z86711 Personal history of pulmonary embolism: Secondary | ICD-10-CM | POA: Diagnosis not present

## 2019-06-27 LAB — COMPREHENSIVE METABOLIC PANEL
ALT: 14 U/L (ref 0–53)
AST: 18 U/L (ref 0–37)
Albumin: 4.1 g/dL (ref 3.5–5.2)
Alkaline Phosphatase: 75 U/L (ref 39–117)
BUN: 13 mg/dL (ref 6–23)
CO2: 29 mEq/L (ref 19–32)
Calcium: 9.1 mg/dL (ref 8.4–10.5)
Chloride: 104 mEq/L (ref 96–112)
Creatinine, Ser: 0.94 mg/dL (ref 0.40–1.50)
GFR: 76.77 mL/min (ref >60.00)
Glucose, Bld: 91 mg/dL (ref 70–99)
Potassium: 4.5 mEq/L (ref 3.5–5.1)
Sodium: 139 mEq/L (ref 135–145)
Total Bilirubin: 0.8 mg/dL (ref 0.2–1.2)
Total Protein: 6.7 g/dL (ref 6.0–8.3)

## 2019-06-27 LAB — LIPID PANEL
Cholesterol: 186 mg/dL (ref 0–200)
HDL: 39.7 mg/dL (ref >39.00)
LDL Cholesterol: 113 mg/dL — ABNORMAL HIGH (ref 0–99)
NonHDL: 145.83
Total CHOL/HDL Ratio: 5
Triglycerides: 164 mg/dL — ABNORMAL HIGH (ref 0.0–149.0)
VLDL: 32.8 mg/dL (ref 0.0–40.0)

## 2019-06-27 LAB — CBC
HCT: 49.3 % (ref 39.0–52.0)
Hemoglobin: 16.6 g/dL (ref 13.0–17.0)
MCHC: 33.7 g/dL (ref 30.0–36.0)
MCV: 98.7 fl (ref 78.0–100.0)
Platelets: 221 10*3/uL (ref 150.0–400.0)
RBC: 4.99 Mil/uL (ref 4.22–5.81)
RDW: 13.5 % (ref 11.5–15.5)
WBC: 5.6 10*3/uL (ref 4.0–10.5)

## 2019-06-27 MED ORDER — TRIAMCINOLONE ACETONIDE 0.1 % EX CREA: 1.0000 "application " | TOPICAL_CREAM | Freq: Two times a day (BID) | CUTANEOUS | 3 refills | Status: DC

## 2019-06-27 NOTE — Assessment & Plan Note (Signed)
Flu shot up to date. Covid-19 vaccine first received. Pneumonia complete. Shingrix counseled. Tetanus due 2030. Colonoscopy aged out. Counseled about sun safety and mole surveillance. Counseled about the dangers of distracted driving. Given 10 year screening recommendations.

## 2019-06-27 NOTE — Progress Notes (Signed)
Subjective:   Patient ID: Richard Cannon, male    DOB: 1935-04-17, 85 y.o.   MRN: [id number]  HPI Here for medicare wellness and physical, no new complaints. Please see A/P for status and treatment of chronic medical problems.   Diet: heart healthy  Physical activity: sedentary Depression/mood screen: negative, wife passed Nov 2020 Hearing: intact to whispered voice Visual acuity: grossly normal with lens, performs annual eye exam  ADLs: capable Fall risk: none Home safety: good Cognitive evaluation: intact to orientation, naming, recall and repetition EOL planning: adv directives discussed    Office Visit from 06/27/2019 in [place] [facility] Total Score  0        Office Visit from 05/08/2017 in [place]  PHQ-9 Total Score  2      I have personally reviewed and have noted 1. The patient's medical and social history - reviewed today no changes 2. Their use of alcohol, tobacco or illicit drugs 3. Their current medications and supplements 4. The patient's functional ability including ADL's, fall risks, home safety risks and hearing or visual impairment. 5. Diet and physical activities 6. Evidence for depression or mood disorders 7. Care team reviewed and updated  Patient Care Team: Hoyt Koch, MD as PCP - General (Internal Medicine) Elsie Stain, MD as Consulting Physician (Pulmonary Disease) Past Medical History:  Diagnosis Date  . Acute pulmonary embolism ([place])   . Benign prostatic hypertrophy   . Deviated septum   . Pneumonia of lower lobe of lung    bilateral  . Respiratory failure with hypoxia [facility])    Past Surgical History:  Procedure Laterality Date  . hernia surgery  05/30/2014  . SEPTOPLASTY N/A 10/05/2015   Procedure: SEPTOPLASTY;  Surgeon: Leta Baptist, MD;  Location: [place];  Service: ENT;  Laterality: N/A;  . TURBINATE REDUCTION Bilateral 10/05/2015   Procedure: BILATERAL  TURBINATE REDUCTION;  Surgeon: Leta Baptist, MD;  Location: [place];  Service: ENT;  Laterality: Bilateral;   Family History  Problem Relation Age of Onset  . Heart disease Mother   . Breast cancer Mother   . Skin cancer Brother     Review of Systems  Constitutional: Negative.   HENT: Negative.   Eyes: Negative.   Respiratory: Negative for cough, chest tightness and shortness of breath.   Cardiovascular: Negative for chest pain, palpitations and leg swelling.  Gastrointestinal: Negative for abdominal distention, abdominal pain, constipation, diarrhea, nausea and vomiting.  Musculoskeletal: Negative.   Skin: Negative.   Neurological: Negative.   Psychiatric/Behavioral: Negative.     Objective:  Physical Exam Constitutional:      Appearance: He is well-developed.  HENT:     Head: Normocephalic and atraumatic.  Cardiovascular:     Rate and Rhythm: Normal rate and regular rhythm.  Pulmonary:     Effort: Pulmonary effort is normal. No respiratory distress.     Breath sounds: Normal breath sounds. No wheezing or rales.  Abdominal:     General: Bowel sounds are normal. There is no distension.     Palpations: Abdomen is soft.     Tenderness: There is no abdominal tenderness. There is no rebound.  Musculoskeletal:     Cervical back: Normal range of motion.  Skin:    General: Skin is warm and dry.  Neurological:     Mental Status: He is alert and oriented to person, place, and time.     Coordination: Coordination  normal.     Vitals:   06/27/19 1252  BP: 136/84  Pulse: (!) 56  Temp: 98.2 F (36.8 C)  TempSrc: Oral  SpO2: 98%  Weight: 192 lb (87.1 kg)  Height: 5\' 11"  (1.803 m)   EKG: Rate 49, axis normal, interval normal, sinus, no st or t wave changes, no prior available for comparison directly  This visit occurred during the SARS-CoV-2 public health emergency.  Safety protocols were in place, including screening questions prior to the visit, additional  usage of staff PPE, and extensive cleaning of exam room while observing appropriate contact time as indicated for disinfecting solutions.   Assessment & Plan:

## 2019-06-27 NOTE — Patient Instructions (Addendum)
We have sent in a cream to use on the back twice a day for the itching.   Health Maintenance, Male Adopting a healthy lifestyle and getting preventive care are important in promoting health and wellness. Ask your health care provider about:  The right schedule for you to have regular tests and exams.  Things you can do on your own to prevent diseases and keep yourself healthy. What should I know about diet, weight, and exercise? Eat a healthy diet   Eat a diet that includes plenty of vegetables, fruits, low-fat dairy products, and lean protein.  Do not eat a lot of foods that are high in solid fats, added sugars, or sodium. Maintain a healthy weight Body mass index (BMI) is a measurement that can be used to identify possible weight problems. It estimates body fat based on height and weight. Your health care provider can help determine your BMI and help you achieve or maintain a healthy weight. Get regular exercise Get regular exercise. This is one of the most important things you can do for your health. Most adults should:  Exercise for at least 150 minutes each week. The exercise should increase your heart rate and make you sweat (moderate-intensity exercise).  Do strengthening exercises at least twice a week. This is in addition to the moderate-intensity exercise.  Spend less time sitting. Even light physical activity can be beneficial. Watch cholesterol and blood lipids Have your blood tested for lipids and cholesterol at 83 years of age, then have this test every 5 years. You may need to have your cholesterol levels checked more often if:  Your lipid or cholesterol levels are high.  You are older than 83 years of age.  You are at high risk for heart disease. What should I know about cancer screening? Many types of cancers can be detected early and may often be prevented. Depending on your health history and family history, you may need to have cancer screening at various ages.  This may include screening for:  Colorectal cancer.  Prostate cancer.  Skin cancer.  Lung cancer. What should I know about heart disease, diabetes, and high blood pressure? Blood pressure and heart disease  High blood pressure causes heart disease and increases the risk of stroke. This is more likely to develop in people who have high blood pressure readings, are of African descent, or are overweight.  Talk with your health care provider about your target blood pressure readings.  Have your blood pressure checked: ? Every 3-5 years if you are 68-43 years of age. ? Every year if you are 44 years old or older.  If you are between the ages of 43 and 40 and are a current or former smoker, ask your health care provider if you should have a one-time screening for abdominal aortic aneurysm (AAA). Diabetes Have regular diabetes screenings. This checks your fasting blood sugar level. Have the screening done:  Once every three years after age 4 if you are at a normal weight and have a low risk for diabetes.  More often and at a younger age if you are overweight or have a high risk for diabetes. What should I know about preventing infection? Hepatitis B If you have a higher risk for hepatitis B, you should be screened for this virus. Talk with your health care provider to find out if you are at risk for hepatitis B infection. Hepatitis C Blood testing is recommended for:  Everyone born from [birth year] through 1965.  Anyone with known risk factors for hepatitis C. Sexually transmitted infections (STIs)  You should be screened each year for STIs, including gonorrhea and chlamydia, if: ? You are sexually active and are younger than 83 years of age. ? You are older than 83 years of age and your health care provider tells you that you are at risk for this type of infection. ? Your sexual activity has changed since you were last screened, and you are at increased risk for chlamydia or gonorrhea.  Ask your health care provider if you are at risk.  Ask your health care provider about whether you are at high risk for HIV. Your health care provider may recommend a prescription medicine to help prevent HIV infection. If you choose to take medicine to prevent HIV, you should first get tested for HIV. You should then be tested every 3 months for as long as you are taking the medicine. Follow these instructions at home: Lifestyle  Do not use any products that contain nicotine or tobacco, such as cigarettes, e-cigarettes, and chewing tobacco. If you need help quitting, ask your health care provider.  Do not use street drugs.  Do not share needles.  Ask your health care provider for help if you need support or information about quitting drugs. Alcohol use  Do not drink alcohol if your health care provider tells you not to drink.  If you drink alcohol: ? Limit how much you have to 0-2 drinks a day. ? Be aware of how much alcohol is in your drink. In the [place] one drink equals one 12 oz bottle of beer (355 mL), one 5 oz glass of wine (148 mL), or one 1 oz glass of hard liquor (44 mL). General instructions  Schedule regular health, dental, and eye exams.  Stay current with your vaccines.  Tell your health care provider if: ? You often feel depressed. ? You have ever been abused or do not feel safe at home. Summary  Adopting a healthy lifestyle and getting preventive care are important in promoting health and wellness.  Follow your health care provider's instructions about healthy diet, exercising, and getting tested or screened for diseases.  Follow your health care provider's instructions on monitoring your cholesterol and blood pressure. This information is not intended to replace advice given to you by your health care provider. Make sure you discuss any questions you have with your health care provider. Document Revised: 05/02/2018 Document Reviewed: 05/02/2018 Elsevier Patient  Education  2020 [employer].

## 2019-06-27 NOTE — Assessment & Plan Note (Signed)
Taking aspirin daily. No new symptoms.

## 2019-06-27 NOTE — Assessment & Plan Note (Signed)
Takes proscar.

## 2019-10-15 DIAGNOSIS — H1131 Conjunctival hemorrhage, right eye: Secondary | ICD-10-CM | POA: Diagnosis not present

## 2019-11-18 DIAGNOSIS — H2513 Age-related nuclear cataract, bilateral: Secondary | ICD-10-CM | POA: Diagnosis not present

## 2019-11-18 DIAGNOSIS — H401131 Primary open-angle glaucoma, bilateral, mild stage: Secondary | ICD-10-CM | POA: Diagnosis not present

## 2020-02-04 DIAGNOSIS — D225 Melanocytic nevi of trunk: Secondary | ICD-10-CM | POA: Diagnosis not present

## 2020-02-04 DIAGNOSIS — X32XXXD Exposure to sunlight, subsequent encounter: Secondary | ICD-10-CM | POA: Diagnosis not present

## 2020-02-04 DIAGNOSIS — L570 Actinic keratosis: Secondary | ICD-10-CM | POA: Diagnosis not present

## 2020-04-15 DIAGNOSIS — H2513 Age-related nuclear cataract, bilateral: Secondary | ICD-10-CM | POA: Diagnosis not present

## 2020-06-02 DIAGNOSIS — H25811 Combined forms of age-related cataract, right eye: Secondary | ICD-10-CM | POA: Diagnosis not present

## 2020-06-02 DIAGNOSIS — H2511 Age-related nuclear cataract, right eye: Secondary | ICD-10-CM | POA: Diagnosis not present

## 2020-07-15 DIAGNOSIS — H2512 Age-related nuclear cataract, left eye: Secondary | ICD-10-CM | POA: Diagnosis not present

## 2020-07-15 DIAGNOSIS — H25812 Combined forms of age-related cataract, left eye: Secondary | ICD-10-CM | POA: Diagnosis not present

## 2020-08-28 ENCOUNTER — Encounter: Payer: Self-pay | Admitting: Internal Medicine

## 2020-08-28 ENCOUNTER — Encounter: Payer: Medicare Other | Admitting: Internal Medicine

## 2020-08-28 ENCOUNTER — Ambulatory Visit (INDEPENDENT_AMBULATORY_CARE_PROVIDER_SITE_OTHER): Payer: Medicare Other | Admitting: Internal Medicine

## 2020-08-28 ENCOUNTER — Other Ambulatory Visit: Payer: Self-pay

## 2020-08-28 VITALS — BP 124/80 | HR 75 | Temp 98.9°F | Resp 18 | Ht 71.0 in | Wt 195.0 lb

## 2020-08-28 DIAGNOSIS — N401 Enlarged prostate with lower urinary tract symptoms: Secondary | ICD-10-CM

## 2020-08-28 DIAGNOSIS — Z0000 Encounter for general adult medical examination without abnormal findings: Secondary | ICD-10-CM | POA: Diagnosis not present

## 2020-08-28 DIAGNOSIS — R350 Frequency of micturition: Secondary | ICD-10-CM | POA: Diagnosis not present

## 2020-08-28 DIAGNOSIS — Z86711 Personal history of pulmonary embolism: Secondary | ICD-10-CM

## 2020-08-28 DIAGNOSIS — R0602 Shortness of breath: Secondary | ICD-10-CM | POA: Diagnosis not present

## 2020-08-28 LAB — LIPID PANEL
Cholesterol: 180 mg/dL (ref 0–200)
HDL: 34.4 mg/dL — ABNORMAL LOW (ref >39.00)
NonHDL: 145.56
Total CHOL/HDL Ratio: 5
Triglycerides: 302 mg/dL — ABNORMAL HIGH (ref 0.0–149.0)
VLDL: 60.4 mg/dL — ABNORMAL HIGH (ref 0.0–40.0)

## 2020-08-28 LAB — TSH: TSH: 1.77 u[IU]/mL (ref 0.35–4.50)

## 2020-08-28 LAB — VITAMIN D 25 HYDROXY (VIT D DEFICIENCY, FRACTURES): VITD: 33.03 ng/mL (ref 30.00–100.00)

## 2020-08-28 LAB — CBC
HCT: 47.8 % (ref 39.0–52.0)
Hemoglobin: 16.1 g/dL (ref 13.0–17.0)
MCHC: 33.7 g/dL (ref 30.0–36.0)
MCV: 98 fl (ref 78.0–100.0)
Platelets: 282 10*3/uL (ref 150.0–400.0)
RBC: 4.88 Mil/uL (ref 4.22–5.81)
RDW: 13.8 % (ref 11.5–15.5)
WBC: 5.7 10*3/uL (ref 4.0–10.5)

## 2020-08-28 LAB — COMPREHENSIVE METABOLIC PANEL
ALT: 15 U/L (ref 0–53)
AST: 19 U/L (ref 0–37)
Albumin: 4 g/dL (ref 3.5–5.2)
Alkaline Phosphatase: 68 U/L (ref 39–117)
BUN: 19 mg/dL (ref 6–23)
CO2: 27 mEq/L (ref 19–32)
Calcium: 9.1 mg/dL (ref 8.4–10.5)
Chloride: 105 mEq/L (ref 96–112)
Creatinine, Ser: 0.98 mg/dL (ref 0.40–1.50)
GFR: 71.3 mL/min (ref >60.00)
Glucose, Bld: 79 mg/dL (ref 70–99)
Potassium: 4.3 mEq/L (ref 3.5–5.1)
Sodium: 137 mEq/L (ref 135–145)
Total Bilirubin: 0.4 mg/dL (ref 0.2–1.2)
Total Protein: 6.5 g/dL (ref 6.0–8.3)

## 2020-08-28 LAB — LDL CHOLESTEROL, DIRECT: Direct LDL: 119 mg/dL

## 2020-08-28 LAB — BRAIN NATRIURETIC PEPTIDE: Pro B Natriuretic peptide (BNP): 18 pg/mL (ref 0.0–100.0)

## 2020-08-28 LAB — VITAMIN B12: Vitamin B-12: 293 pg/mL (ref 211–911)

## 2020-08-28 NOTE — Assessment & Plan Note (Signed)
Taking aspirin daily and unclear if this represents new symptoms so checking d-dimer.

## 2020-08-28 NOTE — Assessment & Plan Note (Signed)
Flu shot up to date. Covid-19 up to date discussed 4th booster. Pneumonia complete. Shingrix counseled. Tetanus up to date. Colonoscopy aged out. Counseled about sun safety and mole surveillance. Counseled about the dangers of distracted driving. Given 10 year screening recommendations.

## 2020-08-28 NOTE — Progress Notes (Signed)
Subjective:   Patient ID: Richard Cannon, male    DOB: [birth year], 86 y.o.   MRN: [id number]  HPI Here for medicare wellness and physical, no new complaints. Please see A/P for status and treatment of chronic medical problems.    HPI #3: Here also for new problem of SOB with exertion. Happens when doing a lot after 1-2 hours of working. He will need to stop to rest and catch breath. Then he is able to keep working. Able to climb a flight of stairs without stopping. Denies chest pains. Had PE after surgery in 2016 and denies remembering if that felt similar. Taking baby aspirin daily.   Diet: heart healhy Physical activity: sedentary Depression/mood screen: negative Hearing: intact to whispered voice Visual acuity: grossly normal s/p cataract surgery, performs annual eye exam  ADLs: capable Fall risk: none Home safety: good Cognitive evaluation: intact to orientation, naming, recall and repetition EOL planning: adv directives discussed  Flowsheet Row Office Visit from 08/28/2020 in [facility] at [facility] [employer]  PHQ-2 Total Score 0      Flowsheet Row Office Visit from 05/08/2017 in [facility]  PHQ-9 Total Score 2      I have personally reviewed and have noted 1. The patient's medical and social history - reviewed today no changes 2. Their use of alcohol, tobacco or illicit drugs 3. Their current medications and supplements 4. The patient's functional ability including ADL's, fall risks, home safety risks and hearing or visual impairment. 5. Diet and physical activities 6. Evidence for depression or mood disorders 7. Care team reviewed and updated 8.  The patient is not on an opioid pain medication.  Patient Care Team: Myrlene Broker, MD as PCP - General (Internal Medicine) Storm Frisk, MD as Consulting Physician (Pulmonary Disease) Past Medical History:  Diagnosis Date  . Acute pulmonary embolism ([facility])   . Benign prostatic  hypertrophy   . Deviated septum   . Pneumonia of lower lobe of lung    bilateral  . Respiratory failure with hypoxia [facility])    Past Surgical History:  Procedure Laterality Date  . hernia surgery  05/30/2014  . SEPTOPLASTY N/A 10/05/2015   Procedure: SEPTOPLASTY;  Surgeon: Newman Pies, MD;  Location: [facility];  Service: ENT;  Laterality: N/A;  . TURBINATE REDUCTION Bilateral 10/05/2015   Procedure: BILATERAL TURBINATE REDUCTION;  Surgeon: Newman Pies, MD;  Location: [facility];  Service: ENT;  Laterality: Bilateral;   Family History  Problem Relation Age of Onset  . Heart disease Mother   . Breast cancer Mother   . Skin cancer Brother    EKG: Rate 63, axis normal, interval normal, sinus, no st or t wave changes, no change compared to prior 2021.  Review of Systems  Constitutional: Negative.   HENT: Negative.   Eyes: Negative.   Respiratory: Positive for shortness of breath. Negative for cough and chest tightness.        On exertion  Cardiovascular: Negative for chest pain, palpitations and leg swelling.  Gastrointestinal: Negative for abdominal distention, abdominal pain, constipation, diarrhea, nausea and vomiting.  Musculoskeletal: Negative.   Skin: Negative.   Neurological: Negative.   Psychiatric/Behavioral: Negative.     Objective:  Physical Exam Constitutional:      Appearance: He is well-developed.  HENT:     Head: Normocephalic and atraumatic.  Cardiovascular:     Rate and Rhythm: Normal rate and regular rhythm.  Pulmonary:  Effort: Pulmonary effort is normal. No respiratory distress.     Breath sounds: Normal breath sounds. No wheezing or rales.  Abdominal:     General: Bowel sounds are normal. There is no distension.     Palpations: Abdomen is soft.     Tenderness: There is no abdominal tenderness. There is no rebound.  Musculoskeletal:     Cervical back: Normal range of motion.  Skin:    General: Skin is warm and dry.   Neurological:     Mental Status: He is alert and oriented to person, place, and time.     Coordination: Coordination normal.     Vitals:   08/28/20 1410  BP: 124/80  Pulse: 75  Resp: 18  Temp: 98.9 F (37.2 C)  TempSrc: Oral  SpO2: 95%  Weight: 195 lb (88.5 kg)  Height: 5\' 11"  (1.803 m)   This visit occurred during the SARS-CoV-2 public health emergency.  Safety protocols were in place, including screening questions prior to the visit, additional usage of staff PPE, and extensive cleaning of exam room while observing appropriate contact time as indicated for disinfecting solutions.   Assessment & Plan:

## 2020-08-28 NOTE — Assessment & Plan Note (Signed)
Taking proscar daily and viagra prn.

## 2020-08-28 NOTE — Assessment & Plan Note (Signed)
EKG done without change. Checking CBC to rule out anemia, vitamin B12 and D and thyroid. Checking BNP and d-dimer. Treat as appropriate.

## 2020-08-28 NOTE — Patient Instructions (Signed)
Your ekg looks good. We will check the labs today.    Health Maintenance, Male Adopting a healthy lifestyle and getting preventive care are important in promoting health and wellness. Ask your health care provider about:  The right schedule for you to have regular tests and exams.  Things you can do on your own to prevent diseases and keep yourself healthy. What should I know about diet, weight, and exercise? Eat a healthy diet  Eat a diet that includes plenty of vegetables, fruits, low-fat dairy products, and lean protein.  Do not eat a lot of foods that are high in solid fats, added sugars, or sodium.   Maintain a healthy weight Body mass index (BMI) is a measurement that can be used to identify possible weight problems. It estimates body fat based on height and weight. Your health care provider can help determine your BMI and help you achieve or maintain a healthy weight. Get regular exercise Get regular exercise. This is one of the most important things you can do for your health. Most adults should:  Exercise for at least 150 minutes each week. The exercise should increase your heart rate and make you sweat (moderate-intensity exercise).  Do strengthening exercises at least twice a week. This is in addition to the moderate-intensity exercise.  Spend less time sitting. Even light physical activity can be beneficial. Watch cholesterol and blood lipids Have your blood tested for lipids and cholesterol at 84 years of age, then have this test every 5 years. You may need to have your cholesterol levels checked more often if:  Your lipid or cholesterol levels are high.  You are older than 84 years of age.  You are at high risk for heart disease. What should I know about cancer screening? Many types of cancers can be detected early and may often be prevented. Depending on your health history and family history, you may need to have cancer screening at various ages. This may include  screening for:  Colorectal cancer.  Prostate cancer.  Skin cancer.  Lung cancer. What should I know about heart disease, diabetes, and high blood pressure? Blood pressure and heart disease  High blood pressure causes heart disease and increases the risk of stroke. This is more likely to develop in people who have high blood pressure readings, are of African descent, or are overweight.  Talk with your health care provider about your target blood pressure readings.  Have your blood pressure checked: ? Every 3-5 years if you are 92-85 years of age. ? Every year if you are 31 years old or older.  If you are between the ages of 78 and 26 and are a current or former smoker, ask your health care provider if you should have a one-time screening for abdominal aortic aneurysm (AAA). Diabetes Have regular diabetes screenings. This checks your fasting blood sugar level. Have the screening done:  Once every three years after age 60 if you are at a normal weight and have a low risk for diabetes.  More often and at a younger age if you are overweight or have a high risk for diabetes. What should I know about preventing infection? Hepatitis B If you have a higher risk for hepatitis B, you should be screened for this virus. Talk with your health care provider to find out if you are at risk for hepatitis B infection. Hepatitis C Blood testing is recommended for:  Everyone born from [birth year] through 1965.  Anyone with known risk factors  for hepatitis C. Sexually transmitted infections (STIs)  You should be screened each year for STIs, including gonorrhea and chlamydia, if: ? You are sexually active and are younger than 84 years of age. ? You are older than 84 years of age and your health care provider tells you that you are at risk for this type of infection. ? Your sexual activity has changed since you were last screened, and you are at increased risk for chlamydia or gonorrhea. Ask your health  care provider if you are at risk.  Ask your health care provider about whether you are at high risk for HIV. Your health care provider may recommend a prescription medicine to help prevent HIV infection. If you choose to take medicine to prevent HIV, you should first get tested for HIV. You should then be tested every 3 months for as long as you are taking the medicine. Follow these instructions at home: Lifestyle  Do not use any products that contain nicotine or tobacco, such as cigarettes, e-cigarettes, and chewing tobacco. If you need help quitting, ask your health care provider.  Do not use street drugs.  Do not share needles.  Ask your health care provider for help if you need support or information about quitting drugs. Alcohol use  Do not drink alcohol if your health care provider tells you not to drink.  If you drink alcohol: ? Limit how much you have to 0-2 drinks a day. ? Be aware of how much alcohol is in your drink. In the [place] one drink equals one 12 oz bottle of beer (355 mL), one 5 oz glass of wine (148 mL), or one 1 oz glass of hard liquor (44 mL). General instructions  Schedule regular health, dental, and eye exams.  Stay current with your vaccines.  Tell your health care provider if: ? You often feel depressed. ? You have ever been abused or do not feel safe at home. Summary  Adopting a healthy lifestyle and getting preventive care are important in promoting health and wellness.  Follow your health care provider's instructions about healthy diet, exercising, and getting tested or screened for diseases.  Follow your health care provider's instructions on monitoring your cholesterol and blood pressure. This information is not intended to replace advice given to you by your health care provider. Make sure you discuss any questions you have with your health care provider. Document Revised: 05/02/2018 Document Reviewed: 05/02/2018 Elsevier Patient Education  2021  [employer].

## 2020-08-29 LAB — D-DIMER, QUANTITATIVE: D-Dimer, Quant: 2.48 mcg/mL FEU — ABNORMAL HIGH (ref <0.50)

## 2020-08-31 ENCOUNTER — Other Ambulatory Visit: Payer: Self-pay | Admitting: Internal Medicine

## 2020-08-31 DIAGNOSIS — R071 Chest pain on breathing: Secondary | ICD-10-CM

## 2020-09-01 ENCOUNTER — Telehealth: Payer: Self-pay | Admitting: Internal Medicine

## 2020-09-01 ENCOUNTER — Ambulatory Visit
Admission: RE | Admit: 2020-09-01 | Discharge: 2020-09-01 | Disposition: A | Discharge status: Home / Self Care | Payer: Medicare Other | Source: Ambulatory Visit | Attending: Internal Medicine | Admitting: Internal Medicine

## 2020-09-01 DIAGNOSIS — R071 Chest pain on breathing: Secondary | ICD-10-CM

## 2020-09-01 DIAGNOSIS — R918 Other nonspecific abnormal finding of lung field: Secondary | ICD-10-CM | POA: Diagnosis not present

## 2020-09-01 DIAGNOSIS — I2699 Other pulmonary embolism without acute cor pulmonale: Secondary | ICD-10-CM | POA: Diagnosis not present

## 2020-09-01 IMAGING — CT CT ANGIO CHEST
2 of 8 series · 10 of 36 positions shown · IV contrast (iopamidol)
Comparison: None.

CLINICAL DATA: Shortness of breath with exertion

EXAM:
CT ANGIOGRAPHY CHEST WITH CONTRAST
TECHNIQUE: Multidetector CT imaging of the chest was performed using the
standard protocol during bolus administration of intravenous
contrast. Multiplanar CT image reconstructions and MIPs were
obtained to evaluate the vascular anatomy.
CONTRAST:  75mL 0OXIPS-BQ4 IOPAMIDOL (0OXIPS-BQ4) INJECTION 76%

[Series 8: cta pulmonary 2.00 bv36 s3 · coronal · 0.64mm/px · 1 of 195 slices shown]
[im 98/195  mediastinal]
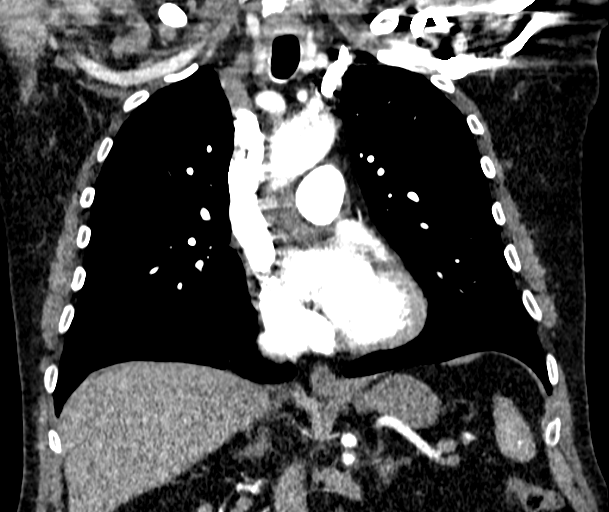

[Series 13: cta pulmonary 1.00 bv36 s3 super d. · axial · 0.76mm/px · z∈[+1520,+1782]mm · 9 of 410 slices shown]
[im 41/410  lung]
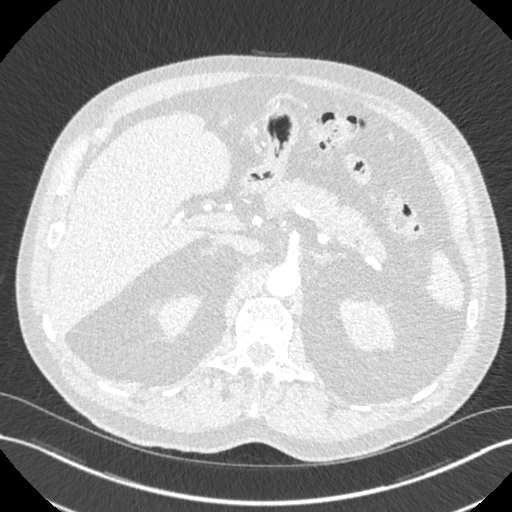
[im 82/410  mediastinal]
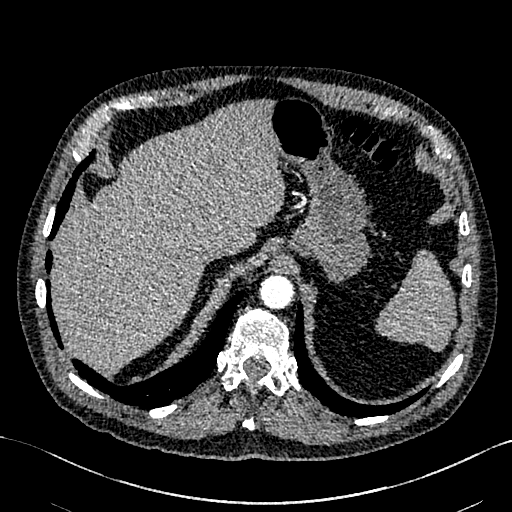
[im 123/410  lung]
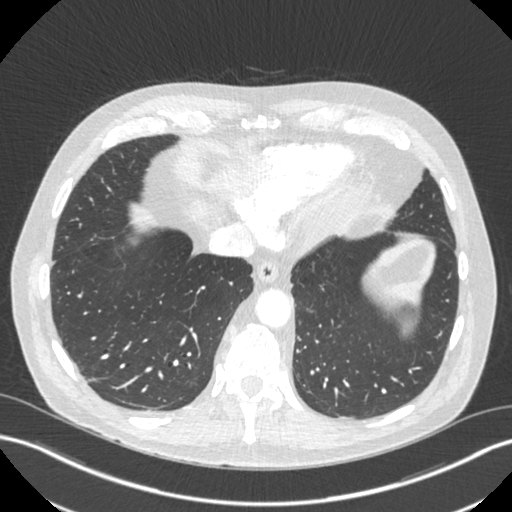
[im 164/410  mediastinal]
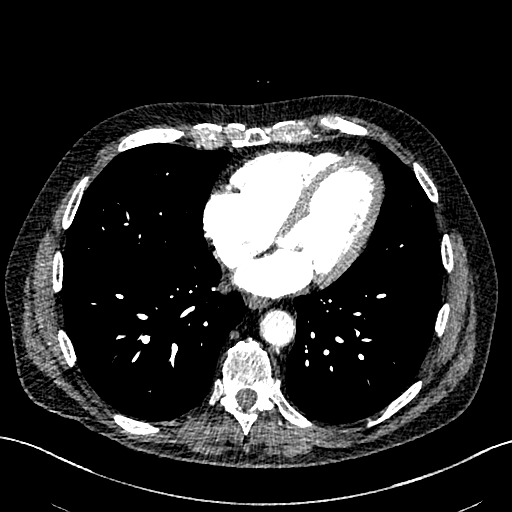
[im 205/410  lung]
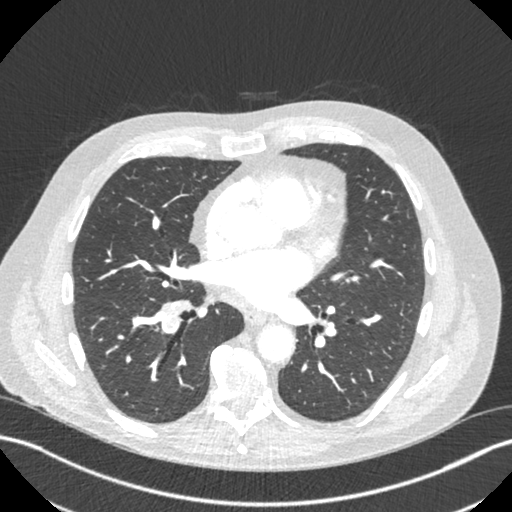
[im 246/410  mediastinal]
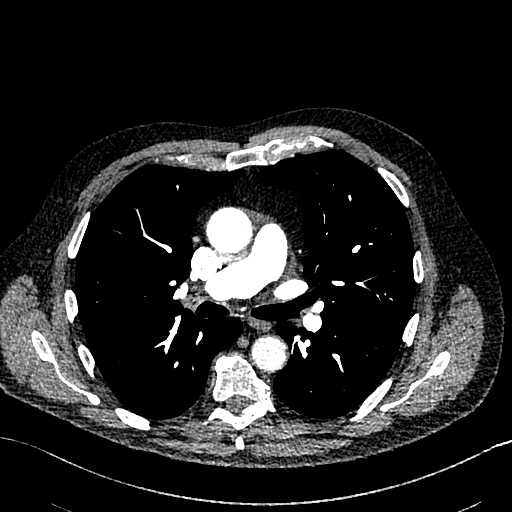
[im 287/410  lung]
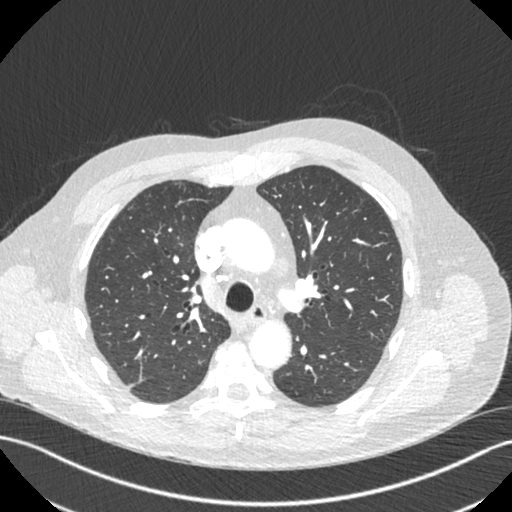
[im 328/410  mediastinal]
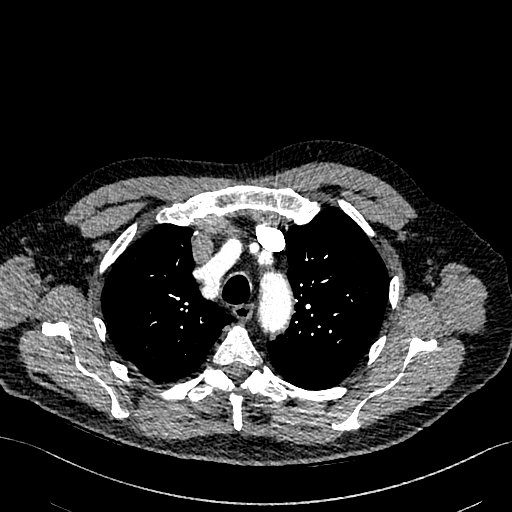
[im 369/410  lung]
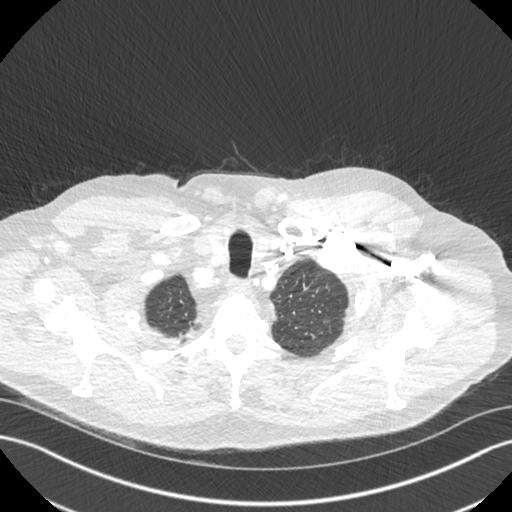

[10 of 36 positions shown; findings below may reference images not displayed]

FINDINGS: Cardiovascular: Satisfactory opacification of the pulmonary arteries
to the segmental level. Nonocclusive pulmonary embolus in the left
upper lobe segmental branch. Nonocclusive pulmonary emboli in the
right lower lobe segmental and subsegmental branches. Normal heart
size. No pericardial effusion.

Mediastinum/Nodes: Calcified mediastinal lymph nodes likely
reflecting sequela prior granulomatous disease. Otherwise no
lymphadenopathy. Thyroid gland, trachea, and esophagus demonstrate
no significant findings.

Lungs/Pleura: Lungs are clear. No pleural effusion or pneumothorax.

Upper Abdomen: No acute abnormality.

Musculoskeletal: No acute osseous abnormality. No aggressive osseous
lesion.

Review of the MIP images confirms the above findings.
IMPRESSION: 1. Acute pulmonary emboli in the left upper lobe and right lower
lobe segmental and subsegmental branches.

Critical Value/emergent results were called by telephone at the time
of interpretation on 09/01/2020 at [DATE] to provider TIGER
SALBRE , who verbally acknowledged these results.

## 2020-09-01 MED ORDER — RIVAROXABAN (XARELTO) VTE STARTER PACK (15 & 20 MG): ORAL_TABLET | ORAL | 0 refills | Status: DC

## 2020-09-01 MED ORDER — IOPAMIDOL (ISOVUE-370) INJECTION 76%
75.0000 mL | Freq: Once | INTRAVENOUS | Status: AC | PRN
  Administered 2020-09-01: 75 mL via INTRAVENOUS

## 2020-09-01 NOTE — Telephone Encounter (Signed)
Unable to get in contact with the patient. LVM asking the patient to return my call here at the office. Office number was provided.  

## 2020-09-01 NOTE — Telephone Encounter (Signed)
Call from radiology with stat results of new PE bilateral lungs. No heart strain or heart enlargement. Please call patient and let him know we are starting him on xarelto. He needs to take 15 mg twice a day for 21 days then switch to 20 mg daily after that. Should have follow up in 3-4 weeks with me. Please ask how he is doing with SOB. If any worsening of his SOB please let me know or new chest pain. He can stop aspirin once he starts the xarelto.

## 2020-09-02 NOTE — Telephone Encounter (Signed)
Spoke with the patient. He verbalized understanding his results. He is aware of his new medication and instructions given by Dr. Okey Dupre. Denies having any chest pain or shortness of breath. Dr. Okey Dupre is aware.

## 2020-09-22 ENCOUNTER — Other Ambulatory Visit: Payer: Self-pay

## 2020-09-22 ENCOUNTER — Ambulatory Visit (INDEPENDENT_AMBULATORY_CARE_PROVIDER_SITE_OTHER): Payer: Medicare Other | Admitting: Internal Medicine

## 2020-09-22 ENCOUNTER — Encounter: Payer: Self-pay | Admitting: Internal Medicine

## 2020-09-22 DIAGNOSIS — I2699 Other pulmonary embolism without acute cor pulmonale: Secondary | ICD-10-CM

## 2020-09-22 MED ORDER — RIVAROXABAN 20 MG PO TABS: 20.0000 mg | ORAL_TABLET | Freq: Every day | ORAL | 3 refills | Status: DC

## 2020-09-22 NOTE — Assessment & Plan Note (Addendum)
Started on xarelto 15 mg BID 09/01/20 for 21 days with plans to transition to 20 mg xarelto. This makes second episode of PE so likely lifelong anticoagulation is warranted. Sent in xarelto 20 mg daily 1 year supply today. Counseled about this and if cost prohibitive to call and not just run out.

## 2020-09-22 NOTE — Progress Notes (Signed)
   Subjective:   Patient ID: Richard Cannon, male    DOB: 10/21/1934, 86 y.o.   MRN: [id number]  HPI The patient is an 86 YO man coming in for follow up PE. Is taking xarelto starter pack and is doing well with this. Denies bleeding or bruising. His SOB is mildly better. Denies new leg swelling or prior leg swelling. Overall he is doing well. Family is curious if this could be related to undetected covid-19 this spring or covid-19 booster. Given that this is his second PE lifelong anticoagulation is indicated or at least 6 months and then potential IVC filter. No pain on breathing or cough.  Review of Systems  Constitutional: Negative.   HENT: Negative.   Eyes: Negative.   Respiratory: Positive for shortness of breath. Negative for cough and chest tightness.   Cardiovascular: Negative for chest pain, palpitations and leg swelling.  Gastrointestinal: Negative for abdominal distention, abdominal pain, constipation, diarrhea, nausea and vomiting.  Musculoskeletal: Negative.   Skin: Negative.   Neurological: Negative.   Psychiatric/Behavioral: Negative.     Objective:  Physical Exam Constitutional:      Appearance: He is well-developed.  HENT:     Head: Normocephalic and atraumatic.  Cardiovascular:     Rate and Rhythm: Normal rate and regular rhythm.  Pulmonary:     Effort: Pulmonary effort is normal. No respiratory distress.     Breath sounds: Normal breath sounds. No wheezing or rales.  Abdominal:     General: Bowel sounds are normal. There is no distension.     Palpations: Abdomen is soft.     Tenderness: There is no abdominal tenderness. There is no rebound.  Musculoskeletal:     Cervical back: Normal range of motion.  Skin:    General: Skin is warm and dry.  Neurological:     Mental Status: He is alert and oriented to person, place, and time.     Coordination: Coordination normal.     Vitals:   09/22/20 1552  BP: 136/70  Pulse: 67  Resp: 18  Temp: 98.8 F (37.1  C)  TempSrc: Oral  SpO2: 96%  Weight: 193 lb 12.8 oz (87.9 kg)  Height: 5\' 11"  (1.803 m)    This visit occurred during the SARS-CoV-2 public health emergency.  Safety protocols were in place, including screening questions prior to the visit, additional usage of staff PPE, and extensive cleaning of exam room while observing appropriate contact time as indicated for disinfecting solutions.   Assessment & Plan:

## 2020-09-22 NOTE — Patient Instructions (Signed)
We will keep you on the xarelto 20 mg daily for life or at least 6 months.

## 2021-02-03 DIAGNOSIS — X32XXXD Exposure to sunlight, subsequent encounter: Secondary | ICD-10-CM | POA: Diagnosis not present

## 2021-02-03 DIAGNOSIS — L570 Actinic keratosis: Secondary | ICD-10-CM | POA: Diagnosis not present

## 2021-02-03 DIAGNOSIS — L820 Inflamed seborrheic keratosis: Secondary | ICD-10-CM | POA: Diagnosis not present

## 2021-02-15 DIAGNOSIS — Z961 Presence of intraocular lens: Secondary | ICD-10-CM | POA: Diagnosis not present

## 2021-02-15 DIAGNOSIS — H401131 Primary open-angle glaucoma, bilateral, mild stage: Secondary | ICD-10-CM | POA: Diagnosis not present

## 2021-08-09 DIAGNOSIS — H401131 Primary open-angle glaucoma, bilateral, mild stage: Secondary | ICD-10-CM | POA: Diagnosis not present

## 2021-08-31 ENCOUNTER — Encounter: Payer: Self-pay | Admitting: Internal Medicine

## 2021-08-31 ENCOUNTER — Ambulatory Visit (INDEPENDENT_AMBULATORY_CARE_PROVIDER_SITE_OTHER): Payer: Medicare Other | Admitting: Internal Medicine

## 2021-08-31 VITALS — BP 124/80 | HR 74 | Resp 18 | Ht 71.0 in | Wt 192.6 lb

## 2021-08-31 DIAGNOSIS — I2782 Chronic pulmonary embolism: Secondary | ICD-10-CM

## 2021-08-31 DIAGNOSIS — Z0000 Encounter for general adult medical examination without abnormal findings: Secondary | ICD-10-CM

## 2021-08-31 DIAGNOSIS — N401 Enlarged prostate with lower urinary tract symptoms: Secondary | ICD-10-CM | POA: Diagnosis not present

## 2021-08-31 DIAGNOSIS — R350 Frequency of micturition: Secondary | ICD-10-CM

## 2021-08-31 LAB — CBC
HCT: 47.4 % (ref 39.0–52.0)
Hemoglobin: 15.8 g/dL (ref 13.0–17.0)
MCHC: 33.4 g/dL (ref 30.0–36.0)
MCV: 98.6 fl (ref 78.0–100.0)
Platelets: 242 10*3/uL (ref 150.0–400.0)
RBC: 4.8 Mil/uL (ref 4.22–5.81)
RDW: 13.6 % (ref 11.5–15.5)
WBC: 6.7 10*3/uL (ref 4.0–10.5)

## 2021-08-31 LAB — COMPREHENSIVE METABOLIC PANEL
ALT: 19 U/L (ref 0–53)
AST: 21 U/L (ref 0–37)
Albumin: 4.1 g/dL (ref 3.5–5.2)
Alkaline Phosphatase: 73 U/L (ref 39–117)
BUN: 17 mg/dL (ref 6–23)
CO2: 28 mEq/L (ref 19–32)
Calcium: 9.2 mg/dL (ref 8.4–10.5)
Chloride: 104 mEq/L (ref 96–112)
Creatinine, Ser: 0.96 mg/dL (ref 0.40–1.50)
GFR: 72.57 mL/min (ref >60.00)
Glucose, Bld: 82 mg/dL (ref 70–99)
Potassium: 4.5 mEq/L (ref 3.5–5.1)
Sodium: 138 mEq/L (ref 135–145)
Total Bilirubin: 0.5 mg/dL (ref 0.2–1.2)
Total Protein: 6.5 g/dL (ref 6.0–8.3)

## 2021-08-31 LAB — LIPID PANEL
Cholesterol: 186 mg/dL (ref 0–200)
HDL: 40.5 mg/dL (ref >39.00)
NonHDL: 145.57
Total CHOL/HDL Ratio: 5
Triglycerides: 238 mg/dL — ABNORMAL HIGH (ref 0.0–149.0)
VLDL: 47.6 mg/dL — ABNORMAL HIGH (ref 0.0–40.0)

## 2021-08-31 LAB — LDL CHOLESTEROL, DIRECT: Direct LDL: 117 mg/dL

## 2021-08-31 MED ORDER — APIXABAN 5 MG PO TABS: 5.0000 mg | ORAL_TABLET | Freq: Two times a day (BID) | ORAL | 3 refills | Status: DC

## 2021-08-31 NOTE — Progress Notes (Signed)
? ?Subjective:  ? ?Patient ID: Richard Cannon, male    DOB: 1934/11/28, 87 y.o.   MRN: [id number] ? ?HPI ?Here for medicare wellness and physical, no new complaints. Please see A/P for status and treatment of chronic medical problems.  ? ?Diet: heart healthy ?Physical activity: sedentary ?Depression/mood screen: negative ?Hearing: intact to whispered voice ?Visual acuity: grossly normal, performs annual eye exam  ?ADLs: capable ?Fall risk: none ?Home safety: good ?Cognitive evaluation: intact to orientation, naming, recall and repetition ?EOL planning: adv directives discussed, in place ? ?Flowsheet Row Office Visit from 08/31/2021 in [facility]  ?PHQ-2 Total Score 0  ? ?  ?  ?Flowsheet Row Office Visit from 05/08/2017 in [facility]  ?PHQ-9 Total Score 2  ? ?  ? ? ?  05/08/2017  ?  1:25 PM 05/10/2018  ?  1:18 PM 06/27/2019  ? 12:56 PM 08/28/2020  ?  2:18 PM 08/31/2021  ?  3:49 PM  ?Fall Risk  ?Falls in the past year? No 0 0 0 0  ?Was there an injury with Fall?    0 0  ?Fall Risk Category Calculator    0 0  ?Fall Risk Category    Low Low  ? ? ?I have personally reviewed and have noted ?1. The patient's medical and social history - reviewed today no changes ?2. Their use of alcohol, tobacco or illicit drugs ?3. Their current medications and supplements ?4. The patient's functional ability including ADL's, fall risks, home safety risks and hearing or visual impairment. ?5. Diet and physical activities ?6. Evidence for depression or mood disorders ?7. Care team reviewed and updated ?8.  The patient is not on an opioid pain medication. ? ?Patient Care Team: ?Myrlene Broker, MD as PCP - General (Internal Medicine) ?Storm Frisk, MD as Consulting Physician (Pulmonary Disease) ?Past Medical History:  ?Diagnosis Date  ? Acute pulmonary embolism (HCC)   ? Benign prostatic hypertrophy   ? Deviated septum   ? Pneumonia of lower lobe of lung   ? bilateral  ? Respiratory  failure with hypoxia (HCC)   ? ?Past Surgical History:  ?Procedure Laterality Date  ? hernia surgery  05/30/2014  ? SEPTOPLASTY N/A 10/05/2015  ? Procedure: SEPTOPLASTY;  Surgeon: Newman Pies, MD;  Location: [facility];  Service: ENT;  Laterality: N/A;  ? TURBINATE REDUCTION Bilateral 10/05/2015  ? Procedure: BILATERAL TURBINATE REDUCTION;  Surgeon: Newman Pies, MD;  Location: [facility];  Service: ENT;  Laterality: Bilateral;  ? ?Family History  ?Problem Relation Age of Onset  ? Heart disease Mother   ? Breast cancer Mother   ? Skin cancer Brother   ? ?Review of Systems  ?Constitutional: Negative.   ?HENT: Negative.    ?Eyes: Negative.   ?Respiratory:  Negative for cough, chest tightness and shortness of breath.   ?Cardiovascular:  Negative for chest pain, palpitations and leg swelling.  ?Gastrointestinal:  Negative for abdominal distention, abdominal pain, constipation, diarrhea, nausea and vomiting.  ?Musculoskeletal: Negative.   ?Skin: Negative.   ?Neurological: Negative.   ?Psychiatric/Behavioral: Negative.    ? ?Objective:  ?Physical Exam ?Constitutional:   ?   Appearance: He is well-developed.  ?HENT:  ?   Head: Normocephalic and atraumatic.  ?Cardiovascular:  ?   Rate and Rhythm: Normal rate and regular rhythm.  ?Pulmonary:  ?   Effort: Pulmonary effort is normal. No respiratory distress.  ?   Breath sounds: Normal breath sounds.  No wheezing or rales.  ?Abdominal:  ?   General: Bowel sounds are normal. There is no distension.  ?   Palpations: Abdomen is soft.  ?   Tenderness: There is no abdominal tenderness. There is no rebound.  ?Musculoskeletal:  ?   Cervical back: Normal range of motion.  ?Skin: ?   General: Skin is warm and dry.  ?Neurological:  ?   Mental Status: He is alert and oriented to person, place, and time.  ?   Coordination: Coordination normal.  ? ? ?Vitals:  ? 08/31/21 1545  ?BP: 124/80  ?Pulse: 74  ?Resp: 18  ?SpO2: 95%  ?Weight: 192 lb 9.6 oz (87.4 kg)  ?Height: 5\' 11"   (1.803 m)  ? ?This visit occurred during the SARS-CoV-2 public health emergency.  Safety protocols were in place, including screening questions prior to the visit, additional usage of staff PPE, and extensive cleaning of exam room while observing appropriate contact time as indicated for disinfecting solutions.  ? ?Assessment & Plan:  ? ?

## 2021-08-31 NOTE — Patient Instructions (Addendum)
We will send in eliquis and if this is too expensive can consider a filter to help instead. ? ? ?

## 2021-09-01 NOTE — Assessment & Plan Note (Signed)
He has now had 2 PE, 1 of which was unprovoked. He is taking xarelto which is expensive and would like change. We discussed options of eliquis, warfarin, IVC filter, stopped and switch to aspirin with resulting risk for further blood clot. We have elected to try switch to eliquis to see if this is a cost savings. If this is expensive we will refer to vascular surgery for consideration of IVC filter and he would then plan to stop anticoagulation. D/C xarelto and rx eliquis.  ?

## 2021-09-01 NOTE — Assessment & Plan Note (Signed)
Flu shot up to date. Covid-19 declines further boosters. Pneumonia complete. Shingrix 1st done declines 2nd. Tetanus due 2030. Colonoscopy aged out. Counseled about sun safety and mole surveillance. Counseled about the dangers of distracted driving. Given 10 year screening recommendations.  ? ?

## 2021-09-01 NOTE — Assessment & Plan Note (Signed)
Symptoms controlled with proscar 5 mg daily.  ?

## 2021-09-03 ENCOUNTER — Encounter: Payer: Self-pay | Admitting: Internal Medicine

## 2021-09-03 DIAGNOSIS — I2782 Chronic pulmonary embolism: Secondary | ICD-10-CM

## 2021-09-08 ENCOUNTER — Ambulatory Visit: Payer: Medicare Other | Admitting: Vascular Surgery

## 2021-09-08 ENCOUNTER — Encounter: Payer: Self-pay | Admitting: Vascular Surgery

## 2021-09-08 VITALS — BP 146/81 | HR 92 | Temp 98.4°F | Resp 16 | Ht 71.0 in | Wt 191.8 lb

## 2021-09-08 DIAGNOSIS — I2694 Multiple subsegmental pulmonary emboli without acute cor pulmonale: Secondary | ICD-10-CM

## 2021-09-08 NOTE — Progress Notes (Signed)
? ? ?[facility] ? ?Patient name: Richard Cannon MRN: [id number] DOB: [birth year] Sex: male ? ?REASON FOR CONSULT: Discuss treatment options for pulmonary embolus ? ?HPI: ?Richard Cannon is a 87 y.o. male, who is here today for treatment option discussion for pulmonary embolus.  He is here with his friend.  He has a interesting past history.  In 2016 he underwent hernia repair and was complicated with urinary retention.  He was readmitted and had some hemoptysis at that time.  CT scan at that time showed saddle pulmonary embolus with mild heart strain.  He was treated with anticoagulation and had resolution.  1 year ago he had episode of some generalized weakness and shortness of breath.  He had a repeat CT scan at that time and on 09/01/2020 CT showed left lower upper lobe and right lower lobe segmental and subsegmental pulmonary embolus.  He was initiated on DOAC.  He is here today for discussion of ongoing care for his 2 episodes of pulmonary embolus.  Lower extremity duplex in 2016 revealed no evidence of DVT.  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 ?Past Medical History:  ?Diagnosis Date  ? Acute pulmonary embolism ([place])   ? Benign prostatic hypertrophy   ? Deviated septum   ? Pneumonia of lower lobe of lung   ? bilateral  ? Respiratory failure with hypoxia ([place])   ? ? ?Family History  ?Problem Relation Age of Onset  ? Heart disease Mother   ? Breast cancer Mother   ? Skin cancer Brother   ? ? ?SOCIAL HISTORY: ?Social History  ? ?Socioeconomic History  ? Marital status: Widowed  ?  Spouse name: Not on file  ? Number of children: 3  ? Years of education: Not on file  ? Highest education level: Not on file  ?Occupational History  ? Occupation: Retired  ?  Comment: Printing/Farming  ?Tobacco Use  ? Smoking status: Never  ? Smokeless tobacco: Never  ?Vaping Use  ? Vaping Use:  Never used  ?Substance and Sexual Activity  ? Alcohol use: No  ?  Alcohol/week: 0.0 standard drinks  ? Drug use: No  ? Sexual activity: Yes  ?Other Topics Concern  ? Not on file  ?Social History Narrative  ? 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  ? ?Social Determinants of Health  ? ?Financial Resource Strain: Not on file  ?Food Insecurity: Not on file  ?Transportation Needs: Not on file  ?Physical Activity: Not on file  ?Stress: Not on file  ?Social Connections: Not on file  ?Intimate Partner Violence: Not on file  ? ? ?Allergies  ?Allergen Reactions  ? Iodine   ? Tetracyclines & Related   ?  Hospitalized after taking - unsure of reaction  ? ? ?Current Outpatient Medications  ?Medication Sig Dispense Refill  ? apixaban (ELIQUIS) 5 MG TABS tablet Take 1 tablet (5 mg total) by mouth 2 (two) times daily. 180 tablet 3  ? finasteride (PROSCAR) 5 MG tablet Take 1 tablet by mouth daily.  2  ? latanoprost (XALATAN) 0.005 % ophthalmic solution   98  ? Multiple Vitamin (MULTI-VITAMIN PO) Take 1 tablet by mouth daily.    ? sildenafil (VIAGRA) 100 MG tablet Take 0.5-1 tablets (50-100 mg total) by mouth daily as needed for erectile dysfunction. (Patient taking differently: Take 20 mg by mouth daily as needed for erectile dysfunction.) 5  tablet 11  ? triamcinolone cream (KENALOG) 0.1 % Apply 1 application topically 2 (two) times daily. 100 g 3  ? ?No current facility-administered medications for this visit.  ? ? ?REVIEW OF SYSTEMS:  ?[X]  denotes positive finding, [ ]  denotes negative finding ?Cardiac  Comments:  ?Chest pain or chest pressure:    ?Shortness of breath upon exertion:    ?Short of breath when lying flat:    ?Irregular heart rhythm:    ?    ?Vascular    ?Pain in calf, thigh, or hip brought on by ambulation:    ?Pain in feet at night that wakes you up from your sleep:     ?Blood clot in your veins:    ?Leg swelling:     ?    ?Pulmonary    ?Oxygen at home:    ?Productive cough:     ?Wheezing:     ?     ?Neurologic    ?Sudden weakness in arms or legs:     ?Sudden numbness in arms or legs:     ?Sudden onset of difficulty speaking or slurred speech:    ?Temporary loss of vision in one eye:     ?Problems with dizziness:     ?    ?Gastrointestinal    ?Blood in stool:     ?Vomited blood:     ?    ?Genitourinary    ?Burning when urinating:     ?Blood in urine:    ?    ?Psychiatric    ?Major depression:     ?    ?Hematologic    ?Bleeding problems:    ?Problems with blood clotting too easily:    ?    ?Skin    ?Rashes or ulcers:    ?    ?Constitutional    ?Fever or chills:    ? ? ?PHYSICAL EXAM: ?Vitals:  ? 09/08/21 1049  ?BP: (!) 146/81  ?Pulse: 92  ?Resp: 16  ?Temp: 98.4 ?F (36.9 ?C)  ?TempSrc: Temporal  ?SpO2: 95%  ?Weight: 191 lb 12.8 oz (87 kg)  ?Height: 5\' 11"  (1.803 m)  ? ? ?GENERAL: The patient is a well-nourished male, in no acute distress. The vital signs are documented above. ?CARDIOVASCULAR: 2+ radial and 2+ dorsalis pedis pulses bilaterally.  Some small varicosities in both lower extremities but no swelling and no hemosiderin deposit ?PULMONARY: There is good air exchange  ?MUSCULOSKELETAL: There are no major deformities or cyanosis. ?NEUROLOGIC: No focal weakness or paresthesias are detected. ?SKIN: There are no ulcers or rashes noted. ?PSYCHIATRIC: The patient has a normal affect. ? ?DATA:  ?CT scan showing bilateral segmental and subsegmental DVT as above ? ?MEDICAL ISSUES: ?Had long discussion with patient.  Apparently he is having a difficult time affording his DOAC.  He is questioning whether filter placement would be appropriate.  I had a long discussion with him regarding the complications of vena cava filter.  I certainly would not recommend filter placement feel that the risk of this outweigh other treatment options.  At his age of 16, I would feel comfortable discontinuing anticoagulation.  He has had 2 separate events separated by 6 years but there was unclear provocation with his initial pulmonary  embolus.  I explained that we have vena cava filter with only prevent pulmonary embolus from the large DVT embolus.  He has been on anticoagulant for approximately 1 year.  I would recommend discontinuing anticoagulant and would only restart this should he develop any  new history of DVT or pulmonary embolus.  He will discuss this with Dr. Sharlet Salina but is leaning towards discontinuing anticoagulation ? ? ?Rosetta Posner, MD FACS ?[facility] ?Office Tel [phone] ?Pager [phone] ? ?Note: Portions of this report may have been transcribed using voice recognition software.  Every effort has been made to ensure accuracy; however, inadvertent computerized transcription errors may still be present. ? ?

## 2021-09-10 ENCOUNTER — Encounter: Payer: Self-pay | Admitting: Internal Medicine

## 2021-09-10 DIAGNOSIS — R351 Nocturia: Secondary | ICD-10-CM | POA: Diagnosis not present

## 2021-09-10 DIAGNOSIS — N401 Enlarged prostate with lower urinary tract symptoms: Secondary | ICD-10-CM | POA: Diagnosis not present

## 2021-09-10 DIAGNOSIS — N5201 Erectile dysfunction due to arterial insufficiency: Secondary | ICD-10-CM | POA: Diagnosis not present

## 2021-09-13 MED ORDER — NEOMYCIN-POLYMYXIN-HC 3.5-10000-1 OT SUSP: 3.0000 [drp] | Freq: Three times a day (TID) | OTIC | 0 refills | Status: DC

## 2021-10-13 DIAGNOSIS — H6691 Otitis media, unspecified, right ear: Secondary | ICD-10-CM | POA: Diagnosis not present

## 2021-11-02 ENCOUNTER — Encounter: Payer: Self-pay | Admitting: Internal Medicine

## 2021-11-02 DIAGNOSIS — L570 Actinic keratosis: Secondary | ICD-10-CM | POA: Diagnosis not present

## 2021-11-02 DIAGNOSIS — L905 Scar conditions and fibrosis of skin: Secondary | ICD-10-CM | POA: Diagnosis not present

## 2021-11-02 DIAGNOSIS — X32XXXD Exposure to sunlight, subsequent encounter: Secondary | ICD-10-CM | POA: Diagnosis not present

## 2021-11-02 DIAGNOSIS — L820 Inflamed seborrheic keratosis: Secondary | ICD-10-CM | POA: Diagnosis not present

## 2021-11-15 ENCOUNTER — Encounter: Payer: Self-pay | Admitting: Internal Medicine

## 2021-11-15 ENCOUNTER — Ambulatory Visit (INDEPENDENT_AMBULATORY_CARE_PROVIDER_SITE_OTHER): Payer: Medicare Other | Admitting: Internal Medicine

## 2021-11-15 ENCOUNTER — Telehealth: Payer: Self-pay | Admitting: Internal Medicine

## 2021-11-15 DIAGNOSIS — H66001 Acute suppurative otitis media without spontaneous rupture of ear drum, right ear: Secondary | ICD-10-CM | POA: Diagnosis not present

## 2021-11-15 MED ORDER — CIPROFLOXACIN-DEXAMETHASONE 0.3-0.1 % OT SUSP: 4.0000 [drp] | Freq: Two times a day (BID) | OTIC | 0 refills | Status: DC

## 2021-11-15 NOTE — Progress Notes (Signed)
   Subjective:   Patient ID: Richard Cannon, male    DOB: 10/15/1934, 87 y.o.   MRN: [id number]  HPI The patient is an 87 YO man coming in for right ear pain.   Review of Systems  Constitutional: Negative.   HENT:  Positive for ear pain.   Eyes: Negative.   Respiratory:  Negative for cough, chest tightness and shortness of breath.   Cardiovascular:  Negative for chest pain, palpitations and leg swelling.  Gastrointestinal:  Negative for abdominal distention, abdominal pain, constipation, diarrhea, nausea and vomiting.  Musculoskeletal: Negative.   Skin: Negative.   Neurological: Negative.   Psychiatric/Behavioral: Negative.      Objective:  Physical Exam Constitutional:      Appearance: He is well-developed.  HENT:     Head: Normocephalic and atraumatic.     Left Ear: Tympanic membrane normal.     Ears:     Comments: Right TM bulging cloudy fluid Cardiovascular:     Rate and Rhythm: Normal rate and regular rhythm.  Pulmonary:     Effort: Pulmonary effort is normal. No respiratory distress.     Breath sounds: Normal breath sounds. No wheezing or rales.  Abdominal:     General: Bowel sounds are normal. There is no distension.     Palpations: Abdomen is soft.     Tenderness: There is no abdominal tenderness. There is no rebound.  Musculoskeletal:     Cervical back: Normal range of motion.  Skin:    General: Skin is warm and dry.  Neurological:     Mental Status: He is alert and oriented to person, place, and time.     Coordination: Coordination normal.     Vitals:   11/15/21 1507  BP: 128/72  Pulse: 75  Resp: 18  SpO2: 94%  Weight: 193 lb 9.6 oz (87.8 kg)  Height: 5\' 11"  (1.803 m)    Assessment & Plan:

## 2021-11-16 NOTE — Telephone Encounter (Signed)
Okay to do that can he take verbal rx for those components? Keep same sig and dispense.

## 2021-11-17 DIAGNOSIS — H6691 Otitis media, unspecified, right ear: Secondary | ICD-10-CM | POA: Insufficient documentation

## 2021-11-17 NOTE — Assessment & Plan Note (Signed)
Rx ciprodex ear drops. Prior course of augmentin did help some but not gone. Still with cloudy fluid in the TM.

## 2021-12-15 DIAGNOSIS — H6123 Impacted cerumen, bilateral: Secondary | ICD-10-CM | POA: Diagnosis not present

## 2021-12-15 DIAGNOSIS — H6981 Other specified disorders of Eustachian tube, right ear: Secondary | ICD-10-CM | POA: Diagnosis not present

## 2021-12-15 DIAGNOSIS — H903 Sensorineural hearing loss, bilateral: Secondary | ICD-10-CM | POA: Diagnosis not present

## 2022-02-07 DIAGNOSIS — H26492 Other secondary cataract, left eye: Secondary | ICD-10-CM | POA: Diagnosis not present

## 2022-02-07 DIAGNOSIS — H401131 Primary open-angle glaucoma, bilateral, mild stage: Secondary | ICD-10-CM | POA: Diagnosis not present

## 2022-02-07 DIAGNOSIS — Z961 Presence of intraocular lens: Secondary | ICD-10-CM | POA: Diagnosis not present

## 2022-02-17 DIAGNOSIS — H401121 Primary open-angle glaucoma, left eye, mild stage: Secondary | ICD-10-CM | POA: Diagnosis not present

## 2022-05-05 DIAGNOSIS — H401111 Primary open-angle glaucoma, right eye, mild stage: Secondary | ICD-10-CM | POA: Diagnosis not present

## 2022-05-26 ENCOUNTER — Ambulatory Visit (HOSPITAL_COMMUNITY)
Admit: 2022-05-26 | Discharge: 2022-05-26 | Disposition: A | Discharge status: Home / Self Care | Payer: Medicare Other | Attending: Internal Medicine | Admitting: Internal Medicine

## 2022-05-26 ENCOUNTER — Inpatient Hospital Stay (HOSPITAL_COMMUNITY)
Admission: EM | Admit: 2022-05-26 | Discharge: 2022-05-28 | DRG: 175 | Disposition: A | Discharge status: Home / Self Care | Payer: Medicare Other | Attending: Internal Medicine | Admitting: Internal Medicine

## 2022-05-26 ENCOUNTER — Emergency Department (HOSPITAL_COMMUNITY): Payer: Medicare Other

## 2022-05-26 ENCOUNTER — Other Ambulatory Visit: Payer: Self-pay

## 2022-05-26 ENCOUNTER — Ambulatory Visit (INDEPENDENT_AMBULATORY_CARE_PROVIDER_SITE_OTHER): Payer: Medicare Other

## 2022-05-26 ENCOUNTER — Encounter: Payer: Self-pay | Admitting: Emergency Medicine

## 2022-05-26 ENCOUNTER — Encounter (HOSPITAL_COMMUNITY): Payer: Self-pay

## 2022-05-26 ENCOUNTER — Ambulatory Visit
Admission: EM | Admit: 2022-05-26 | Discharge: 2022-05-26 | Disposition: A | Discharge status: Home / Self Care | Payer: Medicare Other | Source: Home / Self Care

## 2022-05-26 DIAGNOSIS — R059 Cough, unspecified: Secondary | ICD-10-CM | POA: Diagnosis not present

## 2022-05-26 DIAGNOSIS — Z1152 Encounter for screening for COVID-19: Secondary | ICD-10-CM | POA: Diagnosis not present

## 2022-05-26 DIAGNOSIS — Z86711 Personal history of pulmonary embolism: Secondary | ICD-10-CM

## 2022-05-26 DIAGNOSIS — Z8249 Family history of ischemic heart disease and other diseases of the circulatory system: Secondary | ICD-10-CM | POA: Diagnosis not present

## 2022-05-26 DIAGNOSIS — R0789 Other chest pain: Secondary | ICD-10-CM | POA: Diagnosis not present

## 2022-05-26 DIAGNOSIS — J90 Pleural effusion, not elsewhere classified: Secondary | ICD-10-CM | POA: Diagnosis not present

## 2022-05-26 DIAGNOSIS — Z91048 Other nonmedicinal substance allergy status: Secondary | ICD-10-CM | POA: Diagnosis not present

## 2022-05-26 DIAGNOSIS — M7989 Other specified soft tissue disorders: Secondary | ICD-10-CM | POA: Diagnosis not present

## 2022-05-26 DIAGNOSIS — N400 Enlarged prostate without lower urinary tract symptoms: Secondary | ICD-10-CM | POA: Diagnosis not present

## 2022-05-26 DIAGNOSIS — Z86718 Personal history of other venous thrombosis and embolism: Secondary | ICD-10-CM

## 2022-05-26 DIAGNOSIS — I2699 Other pulmonary embolism without acute cor pulmonale: Secondary | ICD-10-CM | POA: Diagnosis not present

## 2022-05-26 DIAGNOSIS — Z888 Allergy status to other drugs, medicaments and biological substances status: Secondary | ICD-10-CM

## 2022-05-26 DIAGNOSIS — R079 Chest pain, unspecified: Secondary | ICD-10-CM | POA: Diagnosis not present

## 2022-05-26 DIAGNOSIS — I5032 Chronic diastolic (congestive) heart failure: Secondary | ICD-10-CM | POA: Diagnosis present

## 2022-05-26 DIAGNOSIS — J9601 Acute respiratory failure with hypoxia: Secondary | ICD-10-CM | POA: Diagnosis present

## 2022-05-26 DIAGNOSIS — I10 Essential (primary) hypertension: Secondary | ICD-10-CM

## 2022-05-26 DIAGNOSIS — E876 Hypokalemia: Secondary | ICD-10-CM

## 2022-05-26 DIAGNOSIS — N401 Enlarged prostate with lower urinary tract symptoms: Secondary | ICD-10-CM

## 2022-05-26 DIAGNOSIS — I824Z1 Acute embolism and thrombosis of unspecified deep veins of right distal lower extremity: Secondary | ICD-10-CM | POA: Diagnosis not present

## 2022-05-26 DIAGNOSIS — I2609 Other pulmonary embolism with acute cor pulmonale: Secondary | ICD-10-CM | POA: Diagnosis not present

## 2022-05-26 DIAGNOSIS — I110 Hypertensive heart disease with heart failure: Secondary | ICD-10-CM | POA: Diagnosis present

## 2022-05-26 DIAGNOSIS — Z66 Do not resuscitate: Secondary | ICD-10-CM | POA: Diagnosis not present

## 2022-05-26 DIAGNOSIS — R350 Frequency of micturition: Secondary | ICD-10-CM

## 2022-05-26 DIAGNOSIS — Z7982 Long term (current) use of aspirin: Secondary | ICD-10-CM

## 2022-05-26 DIAGNOSIS — I82511 Chronic embolism and thrombosis of right femoral vein: Secondary | ICD-10-CM | POA: Diagnosis not present

## 2022-05-26 DIAGNOSIS — Z7901 Long term (current) use of anticoagulants: Secondary | ICD-10-CM | POA: Diagnosis not present

## 2022-05-26 DIAGNOSIS — I2694 Multiple subsegmental pulmonary emboli without acute cor pulmonale: Secondary | ICD-10-CM | POA: Diagnosis not present

## 2022-05-26 DIAGNOSIS — I82411 Acute embolism and thrombosis of right femoral vein: Secondary | ICD-10-CM | POA: Diagnosis present

## 2022-05-26 DIAGNOSIS — R0602 Shortness of breath: Secondary | ICD-10-CM | POA: Diagnosis not present

## 2022-05-26 HISTORY — DX: Acute embolism and thrombosis of unspecified deep veins of unspecified lower extremity: I82.409

## 2022-05-26 LAB — BASIC METABOLIC PANEL
Anion gap: 9 (ref 5–15)
BUN: 13 mg/dL (ref 8–23)
CO2: 24 mmol/L (ref 22–32)
Calcium: 8.7 mg/dL — ABNORMAL LOW (ref 8.9–10.3)
Chloride: 103 mmol/L (ref 98–111)
Creatinine, Ser: 1.06 mg/dL (ref 0.61–1.24)
GFR, Estimated: >60 mL/min (ref >60)
Glucose, Bld: 160 mg/dL — ABNORMAL HIGH (ref 70–99)
Potassium: 3.4 mmol/L — ABNORMAL LOW (ref 3.5–5.1)
Sodium: 136 mmol/L (ref 135–145)

## 2022-05-26 LAB — CBC
HCT: 48.8 % (ref 39.0–52.0)
Hemoglobin: 16 g/dL (ref 13.0–17.0)
MCH: 32.4 pg (ref 26.0–34.0)
MCHC: 32.8 g/dL (ref 30.0–36.0)
MCV: 98.8 fL (ref 80.0–100.0)
Platelets: 345 10*3/uL (ref 150–400)
RBC: 4.94 MIL/uL (ref 4.22–5.81)
RDW: 12.8 % (ref 11.5–15.5)
WBC: 10.1 10*3/uL (ref 4.0–10.5)
nRBC: 0 % (ref 0.0–0.2)

## 2022-05-26 LAB — APTT: aPTT: 30 seconds (ref 24–36)

## 2022-05-26 LAB — PROTIME-INR
INR: 1 (ref 0.8–1.2)
Prothrombin Time: 13.5 seconds (ref 11.4–15.2)

## 2022-05-26 LAB — TROPONIN I (HIGH SENSITIVITY)
Troponin I (High Sensitivity): 3 ng/L (ref <18)
Troponin I (High Sensitivity): 5 ng/L (ref <18)

## 2022-05-26 LAB — RESP PANEL BY RT-PCR (RSV, FLU A&B, COVID)  RVPGX2
Influenza A by PCR: NEGATIVE
Influenza B by PCR: NEGATIVE
Resp Syncytial Virus by PCR: NEGATIVE
SARS Coronavirus 2 by RT PCR: NEGATIVE

## 2022-05-26 MED ORDER — ACETAMINOPHEN 650 MG RE SUPP: 650.0000 mg | Freq: Four times a day (QID) | RECTAL | Status: DC | PRN

## 2022-05-26 MED ORDER — MORPHINE SULFATE (PF) 2 MG/ML IV SOLN: 2.0000 mg | INTRAVENOUS | Status: DC | PRN

## 2022-05-26 MED ORDER — ACETAMINOPHEN 325 MG PO TABS: 650.0000 mg | ORAL_TABLET | Freq: Four times a day (QID) | ORAL | Status: DC | PRN

## 2022-05-26 MED ORDER — ALBUTEROL SULFATE (2.5 MG/3ML) 0.083% IN NEBU: 2.5000 mg | INHALATION_SOLUTION | RESPIRATORY_TRACT | Status: DC | PRN

## 2022-05-26 MED ORDER — CHLORHEXIDINE GLUCONATE CLOTH 2 % EX PADS
6.0000 | MEDICATED_PAD | Freq: Every day | CUTANEOUS | Status: DC
  Administered 2022-05-26 – 2022-05-28 (×3): 6 via TOPICAL

## 2022-05-26 MED ORDER — ASPIRIN 81 MG PO TBEC
81.0000 mg | DELAYED_RELEASE_TABLET | Freq: Every day | ORAL | Status: DC
  Administered 2022-05-27: 81 mg via ORAL
  Filled 2022-05-26 (×2): qty 1

## 2022-05-26 MED ORDER — CHLORHEXIDINE GLUCONATE CLOTH 2 % EX PADS: 6.0000 | MEDICATED_PAD | Freq: Every day | CUTANEOUS | Status: DC

## 2022-05-26 MED ORDER — OXYCODONE HCL 5 MG PO TABS: 5.0000 mg | ORAL_TABLET | ORAL | Status: DC | PRN

## 2022-05-26 MED ORDER — POTASSIUM CHLORIDE 20 MEQ PO PACK
20.0000 meq | PACK | Freq: Once | ORAL | Status: AC
  Administered 2022-05-27: 20 meq via ORAL
  Filled 2022-05-26: qty 1

## 2022-05-26 MED ORDER — FINASTERIDE 5 MG PO TABS
5.0000 mg | ORAL_TABLET | Freq: Every day | ORAL | Status: DC
  Administered 2022-05-27 – 2022-05-28 (×2): 5 mg via ORAL
  Filled 2022-05-26 (×2): qty 1

## 2022-05-26 MED ORDER — HEPARIN BOLUS VIA INFUSION
5000.0000 [IU] | Freq: Once | INTRAVENOUS | Status: AC
  Administered 2022-05-26: 5000 [IU] via INTRAVENOUS

## 2022-05-26 MED ORDER — POTASSIUM CHLORIDE 20 MEQ PO PACK: 40.0000 meq | PACK | Freq: Once | ORAL | Status: DC

## 2022-05-26 MED ORDER — HEPARIN (PORCINE) 25000 UT/250ML-% IV SOLN
1500.0000 [IU]/h | INTRAVENOUS | Status: DC
  Administered 2022-05-26: 1400 [IU]/h via INTRAVENOUS
  Administered 2022-05-27 (×2): 1500 [IU]/h via INTRAVENOUS
  Filled 2022-05-26 (×3): qty 250

## 2022-05-26 MED ORDER — IOHEXOL 350 MG/ML SOLN
75.0000 mL | Freq: Once | INTRAVENOUS | Status: AC | PRN
  Administered 2022-05-26: 75 mL via INTRAVENOUS

## 2022-05-26 NOTE — Progress Notes (Signed)
2250 Pt arrived to room 8 via stretcher on tele and O2, VSS A/Ox3, all personal belongings given to pt's daughter Izora Gala including, Financial controller, Acupuncturist, gift card, and insurance cards.

## 2022-05-26 NOTE — ED Triage Notes (Signed)
Pt brought over from ultrasound with c/o right leg swelling and shortness of breath x 3 days.

## 2022-05-26 NOTE — Assessment & Plan Note (Addendum)
-  Continue Proscar -Patient denies urinary retention symptoms.

## 2022-05-26 NOTE — Assessment & Plan Note (Addendum)
-  Potassium 3.4 at time of admission -Repleted and is stable currently -Continue telemetry monitoring -Follow electrolytes trend.

## 2022-05-26 NOTE — ED Provider Notes (Signed)
[facility] EMERGENCY DEPARTMENT Provider Note   CSN: [id number] Arrival date & time: 05/26/22  1442     History  Chief Complaint  Patient presents with   Shortness of Breath    Richard Cannon is a 88 y.o. male.  He has prior history of PE but has been off Eliquis for at least 6 months due to financial concerns.  He is complaining of 3 days of atraumatic right calf pain and 2 days of pleuritic left-sided chest pain.  He does feel mildly short of breath.  Nonproductive cough.  No fevers or chills.  No hemoptysis.  He was seen at urgent care and sent for an ultrasound which was positive and referred to the ED for further eval.  The history is provided by the patient.  Shortness of Breath Severity:  Moderate Onset quality:  Gradual Duration:  2 days Timing:  Intermittent Progression:  Unchanged Chronicity:  Recurrent Relieved by:  None tried Worsened by:  Nothing Ineffective treatments:  None tried Associated symptoms: chest pain and cough   Associated symptoms: no abdominal pain, no fever, no hemoptysis, no sputum production, no vomiting and no wheezing        Home Medications Prior to Admission medications   Medication Sig Start Date End Date Taking? Authorizing Provider  apixaban (ELIQUIS) 5 MG TABS tablet Take 1 tablet (5 mg total) by mouth 2 (two) times daily. Patient not taking: Reported on 11/15/2021 08/31/21   Hoyt Koch, MD  aspirin EC 81 MG tablet Take 81 mg by mouth daily. Swallow whole.    [provider]  ciprofloxacin-dexamethasone (CIPRODEX) OTIC suspension Place 4 drops into the right ear 2 (two) times daily. 11/15/21   Hoyt Koch, MD  finasteride (PROSCAR) 5 MG tablet Take 1 tablet by mouth daily. 06/27/14   [provider]  latanoprost (XALATAN) 0.005 % ophthalmic solution  04/07/17   [provider]  Multiple Vitamin (MULTI-VITAMIN PO) Take 1 tablet by mouth daily.    [provider]  sildenafil (VIAGRA)  100 MG tablet Take 0.5-1 tablets (50-100 mg total) by mouth daily as needed for erectile dysfunction. Patient taking differently: Take 20 mg by mouth daily as needed for erectile dysfunction. 04/25/16   Hoyt Koch, MD  triamcinolone cream (KENALOG) 0.1 % Apply 1 application topically 2 (two) times daily. Patient not taking: Reported on 11/15/2021 06/27/19   Hoyt Koch, MD      Allergies    Iodine and Tetracyclines & related    Review of Systems   Review of Systems  Constitutional:  Negative for fever.  Respiratory:  Positive for cough and shortness of breath. Negative for hemoptysis, sputum production and wheezing.   Cardiovascular:  Positive for chest pain.  Gastrointestinal:  Negative for abdominal pain and vomiting.    Physical Exam Updated Vital Signs BP (!) 166/85   Pulse (!) 101   Resp (!) 27   Ht 5\' 10"  (1.778 m)   Wt 88.5 kg   SpO2 93%   BMI 27.98 kg/m  Physical Exam Vitals and nursing note reviewed.  Constitutional:      General: He is not in acute distress.    Appearance: He is well-developed.  HENT:     Head: Normocephalic and atraumatic.  Eyes:     Conjunctiva/sclera: Conjunctivae normal.  Cardiovascular:     Rate and Rhythm: Regular rhythm. Tachycardia present.     Heart sounds: No murmur heard. Pulmonary:     Effort: Tachypnea  and accessory muscle usage present. No respiratory distress.     Breath sounds: Normal breath sounds.  Abdominal:     Palpations: Abdomen is soft.     Tenderness: There is no abdominal tenderness.  Musculoskeletal:        General: No swelling.     Cervical back: Neck supple.     Right lower leg: Tenderness present. No edema.     Left lower leg: No tenderness. No edema.  Skin:    General: Skin is warm and dry.     Capillary Refill: Capillary refill takes less than 2 seconds.  Neurological:     General: No focal deficit present.     Mental Status: He is alert.     ED Results / Procedures / Treatments    Labs (all labs ordered are listed, but only abnormal results are displayed) Labs Reviewed  BASIC METABOLIC PANEL - Abnormal; Notable for the following components:      Result Value   Potassium 3.4 (*)    Glucose, Bld 160 (*)    Calcium 8.7 (*)    All other components within normal limits  COMPREHENSIVE METABOLIC PANEL - Abnormal; Notable for the following components:   Glucose, Bld 119 (*)    Calcium 8.1 (*)    Total Protein 6.2 (*)    Albumin 3.2 (*)    All other components within normal limits  RESP PANEL BY RT-PCR (RSV, FLU A&B, COVID)  RVPGX2  MRSA NEXT GEN BY PCR, NASAL  CBC  PROTIME-INR  HEPARIN LEVEL (UNFRACTIONATED)  APTT  CBC  MAGNESIUM  HEPARIN LEVEL (UNFRACTIONATED)  TROPONIN I (HIGH SENSITIVITY)  TROPONIN I (HIGH SENSITIVITY)    EKG EKG Interpretation  Date/Time:  Thursday May 26 2022 14:57:10 EST Ventricular Rate:  104 PR Interval:  146 QRS Duration: 102 QT Interval:  344 QTC Calculation: 452 R Axis:   44 Text Interpretation: Sinus tachycardia ST & T wave abnormality, consider inferior ischemia Abnormal ECG No previous ECGs available Confirmed by Aletta Edouard [phone]) on 05/26/2022 2:58:48 PM  Radiology CT Angio Chest PE W/Cm &/Or Wo Cm  Result Date: 05/26/2022 CLINICAL DATA:  SOB, chest pain, DVT. EXAM: CT ANGIOGRAPHY CHEST WITH CONTRAST TECHNIQUE: Multidetector CT imaging of the chest was performed using the standard protocol during bolus administration of intravenous contrast. Multiplanar CT image reconstructions and MIPs were obtained to evaluate the vascular anatomy. RADIATION DOSE REDUCTION: This exam was performed according to the departmental dose-optimization program which includes automated exposure control, adjustment of the mA and/or kV according to patient size and/or use of iterative reconstruction technique. CONTRAST:  75 mL OMNIPAQUE IOHEXOL 350 MG/ML SOLN COMPARISON:  09/01/2020 FINDINGS: Cardiovascular: Mild cardiomegaly. Large filling  defects in central pulmonary arteries extending to branch pulmonary arteries consistent with extensive bilateral PE. No aortic aneurysm or dissection identified. No pericardial effusion. Mediastinum/Nodes: No enlarged mediastinal, hilar, or axillary lymph nodes. Thyroid gland, trachea, and esophagus demonstrate no significant findings. Lungs/Pleura: Pulmonary interstitial prominence identified consistent with interstitial edema. There is alveolar consolidation or volume loss in the left base. This is likely to be infarction in the setting of PE. Small left-sided pleural effusion. Minimal right basilar subsegmental atelectasis. Upper Abdomen: No acute abnormality. Musculoskeletal: Thoracic degenerative changes. No acute osseous abnormalities identified. Review of the MIP images confirms the above findings. IMPRESSION: 1. Large bilateral pulmonary emboli. 2. Prominent mild cardiomegaly and evidence of interstitial pulmonary edema suggestive of CHF or volume overload. 3. Alveolar process left base consistent with infarction. Differential pneumonia  or volume loss. Findings were discussed with and acknowledged by Dr. Melina Copa at 6:50 p.m. Electronically Signed   By: Sammie Bench M.D.   On: 05/26/2022 18:56   US Venous Img Lower Unilateral Right  Result Date: 05/26/2022 CLINICAL DATA:  RIGHT lower extremity swelling and pain for 2 days, history of deep venous thrombosis and pulmonary embolism EXAM: RIGHT LOWER EXTREMITY VENOUS DOPPLER ULTRASOUND TECHNIQUE: Gray-scale sonography with compression, as well as color and duplex ultrasound, were performed to evaluate the deep venous system(s) from the level of the common femoral vein through the popliteal and proximal calf veins. COMPARISON:  None Available. FINDINGS: VENOUS Extensive hypoechoic thrombus throughout the deep venous system of the RIGHT lower extremity. This includes distal common femoral vein, profundus femoral vein, femoral vein, and popliteal vein.  Additional thrombus is seen within the RIGHT calf veins. Observed vessels are noncompressible and demonstrate absent spontaneous venous flow. A short segment of the proximal RIGHT femoral vein shows more hyperechoic thrombus which likely represents sequela of prior thrombosis. Limited views of the contralateral common femoral vein are unremarkable. OTHER None. Limitations: none IMPRESSION: Extensive acute deep venous thrombosis throughout the RIGHT lower extremity. 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 Critical Value/emergent results were called by telephone at the time of interpretation on 05/26/2022 at 1450 hours to provider [facility] , who verbally acknowledged these results. Per provider request, patient taken to emergency room. Electronically Signed   By: Lavonia Dana M.D.   On: 05/26/2022 15:06   DG Chest 2 View  Result Date: 05/26/2022 CLINICAL DATA:  Cough, chest pain EXAM: CHEST - 2 VIEW COMPARISON:  Chest radiograph done on 07/02/2014 and CT done on 09/01/2020 FINDINGS: Transverse diameter of heart is in the upper limits of normal. Thoracic aorta is tortuous. Increased markings are seen in both lower lung fields, more so on the left side. There is blunting of left lateral CP angle. There is no pneumothorax. IMPRESSION: There are no signs of pulmonary edema or focal pulmonary consolidation. Small left pleural effusion. Linear densities are seen in the lower lung fields, more so on the left side suggesting subsegmental atelectasis and possibly scarring. Electronically Signed   By: Elmer Picker M.D.   On: 05/26/2022 12:22    Procedures .Critical Care  Performed by: Hayden Rasmussen, MD Authorized by: Hayden Rasmussen, MD   Critical care provider statement:    Critical care time (minutes):  45   Critical care time was exclusive of:  Separately billable procedures and treating other patients   Critical care was necessary to treat or prevent  imminent or life-threatening deterioration of the following conditions:  Respiratory failure and circulatory failure   Critical care was time spent personally by me on the following activities:  Development of treatment plan with patient or surrogate, discussions with consultants, evaluation of patient's response to treatment, examination of patient, obtaining history from patient or surrogate, ordering and performing treatments and interventions, ordering and review of laboratory studies, ordering and review of radiographic studies, pulse oximetry, re-evaluation of patient's condition and review of old charts   I assumed direction of critical care for this patient from another provider in my specialty: no       Medications Ordered in ED Medications  heparin bolus via infusion 5,000 Units (5,000 Units Intravenous Bolus from Bag 05/26/22 1608)    Followed by  heparin ADULT infusion 100 units/mL (25000 units/22mL) (1,500 Units/hr Intravenous New Bag/Given 05/27/22 0536)  aspirin EC  tablet 81 mg (81 mg Oral Given 05/27/22 0909)  finasteride (PROSCAR) tablet 5 mg (5 mg Oral Given 05/27/22 0909)  acetaminophen (TYLENOL) tablet 650 mg (has no administration in time range)    Or  acetaminophen (TYLENOL) suppository 650 mg (has no administration in time range)  oxyCODONE (Oxy IR/ROXICODONE) immediate release tablet 5 mg (has no administration in time range)  morphine (PF) 2 MG/ML injection 2 mg (has no administration in time range)  albuterol (PROVENTIL) (2.5 MG/3ML) 0.083% nebulizer solution 2.5 mg (has no administration in time range)  Chlorhexidine Gluconate Cloth 2 % PADS 6 each (6 each Topical Given 05/27/22 0910)  iohexol (OMNIPAQUE) 350 MG/ML injection 75 mL (75 mLs Intravenous Contrast Given 05/26/22 1841)  potassium chloride (KLOR-CON) packet 20 mEq (20 mEq Oral Given 05/27/22 0038)    ED Course/ Medical Decision Making/ A&P Clinical Course as of 05/27/22 0934  Thu May 26, 2022  1931 Discussed with Dr  Josph Macho [facility] hospitalist who will evaluate patient for admission. [MB]  1941 I updated patient on plan for admission and he is in agreement. [MB]    Clinical Course User Index [MB] Hayden Rasmussen, MD                           Medical Decision Making Amount and/or Complexity of Data Reviewed Labs: ordered.  Risk Decision regarding hospitalization.   This patient complains of right calf pain pleuritic left chest pain; this involves an extensive number of treatment Options and is a complaint that carries with it a high risk of complications and morbidity. The differential includes DVT, PE, musculoskeletal, vascular, pneumonia, pneumothorax, ACS  I ordered, reviewed and interpreted labs, which included CBC with normal white count normal hemoglobin, chemistries mildly low potassium elevated glucose, troponins flat, COVID and flu negative, INR normal I ordered medication IV heparin and reviewed PMP when indicated. I ordered imaging studies which included CT angio chest and I independently    visualized and interpreted imaging which showed extensive bilateral PE possible pulmonary infarction possible fluid overload Additional history obtained from patient's wife Previous records obtained and reviewed in epic including prior ED visits I consulted Dr. Josph Macho [facility] hospitalist and discussed lab and imaging findings and discussed disposition.  Cardiac monitoring reviewed, normal sinus rhythm Social determinants considered, financial concerns and stress Critical Interventions: Initiation of IV heparin for DVT PE  After the interventions stated above, I reevaluated the patient and found patient to still be in discomfort although oxygenating well on nasal cannula.   Admission and further testing considered, he will need admission to the hospital for continued IV heparin and ultimately will need long-term anticoagulation.         Final Clinical Impression(s) / ED Diagnoses Final  diagnoses:  Bilateral pulmonary embolism ([place])  Acute respiratory failure with hypoxia [facility])    Rx / DC Orders ED Discharge Orders     None         Hayden Rasmussen, MD 05/27/22 [id number]

## 2022-05-26 NOTE — ED Triage Notes (Addendum)
Pt reports intermittent productive cough for last several days, reports left sided chest discomfort with movement,deep breath since yesterday, and right leg swelling x2 days. Denies any known fever. Reports elevated BP readings at home.  Pt is taking otc 24 hr medication for cough and loratadine for symptoms.

## 2022-05-26 NOTE — ED Notes (Signed)
Pt has ready bed on 2nd floor- just informed by Einar Pheasant, RN not to bring pt up yet as Dr Earnest Conroy is a/w IR at [facility] to call her back - pt admission bed status could possibly be changed

## 2022-05-26 NOTE — ED Provider Notes (Signed)
[facility]    CSN: [id number] Arrival date & time: 05/26/22  1037      History   Chief Complaint Chief Complaint  Patient presents with   Cough    HPI Richard Cannon is a 88 y.o. male.   Patient presents today for several days of productive cough that is also congested.  Also endorses chest congestion.  More recently, began having shortness of breath with walking, significant pain when trying to take a deep breath.  Reports has been taking shallow breaths because of this.  He denies fever, wheezing or chest tightness, nasal congestion or runny nose, sore throat, headache, ear pain, abdominal pain, nausea/vomiting, diarrhea, and decreased appetite.  Reports yesterday, he developed right lower extremity swelling and thinks it is a little bit red.  Reports history of bilateral blood clots, PEs after hernia surgery a couple of years ago.  Reports stopped Eliquis about 6 months ago by hematologist.     Past Medical History:  Diagnosis Date   Acute pulmonary embolism ([place])    Benign prostatic hypertrophy    Deviated septum    Pneumonia of lower lobe of lung    bilateral   Respiratory failure with hypoxia [facility])     Patient Active Problem List   Diagnosis Date Noted   Right otitis media 11/17/2021   Routine general medical examination at a health care facility 04/25/2016   Pulmonary embolism ([place])    Benign prostatic hyperplasia     Past Surgical History:  Procedure Laterality Date   EYE SURGERY     hernia surgery  05/30/2014   SEPTOPLASTY N/A 10/05/2015   Procedure: SEPTOPLASTY;  Surgeon: Leta Baptist, MD;  Location: [place];  Service: ENT;  Laterality: N/A;   TURBINATE REDUCTION Bilateral 10/05/2015   Procedure: BILATERAL TURBINATE REDUCTION;  Surgeon: Leta Baptist, MD;  Location: [place];  Service: ENT;  Laterality: Bilateral;       Home Medications    Prior to Admission medications   Medication Sig Start Date End Date  Taking? Authorizing Provider  aspirin EC 81 MG tablet Take 81 mg by mouth daily. Swallow whole.   Yes [provider]  apixaban (ELIQUIS) 5 MG TABS tablet Take 1 tablet (5 mg total) by mouth 2 (two) times daily. Patient not taking: Reported on 11/15/2021 08/31/21   Hoyt Koch, MD  ciprofloxacin-dexamethasone [facility]) OTIC suspension Place 4 drops into the right ear 2 (two) times daily. 11/15/21   Hoyt Koch, MD  finasteride (PROSCAR) 5 MG tablet Take 1 tablet by mouth daily. 06/27/14   [provider]  latanoprost (XALATAN) 0.005 % ophthalmic solution  04/07/17   [provider]  Multiple Vitamin (MULTI-VITAMIN PO) Take 1 tablet by mouth daily.    [provider]  sildenafil (VIAGRA) 100 MG tablet Take 0.5-1 tablets (50-100 mg total) by mouth daily as needed for erectile dysfunction. Patient taking differently: Take 20 mg by mouth daily as needed for erectile dysfunction. 04/25/16   Hoyt Koch, MD  triamcinolone cream (KENALOG) 0.1 % Apply 1 application topically 2 (two) times daily. Patient not taking: Reported on 11/15/2021 06/27/19   Hoyt Koch, MD    Family History Family History  Problem Relation Age of Onset   Heart disease Mother    Breast cancer Mother    Skin cancer Brother     Social History Social History   Tobacco Use   Smoking status: Never   Smokeless tobacco:  Never  Vaping Use   Vaping Use: Never used  Substance Use Topics   Alcohol use: No    Alcohol/week: 0.0 standard drinks of alcohol   Drug use: No     Allergies   Iodine and Tetracyclines & related   Review of Systems Review of Systems Per HPI  Physical Exam Triage Vital Signs ED Triage Vitals  Enc Vitals Group     BP 05/26/22 1138 (!) 157/88     Pulse Rate 05/26/22 1138 87     Resp 05/26/22 1138 20     Temp 05/26/22 1138 98.1 F (36.7 C)     Temp Source 05/26/22 1138 Oral     SpO2 05/26/22 1138 92 %     Weight --       Height --      Head Circumference --      Peak Flow --      Pain Score 05/26/22 1135 0     Pain Loc --      Pain Edu? --      Excl. in [place]? --    No data found.  Updated Vital Signs BP (!) 157/88 (BP Location: Right Arm)   Pulse 87   Temp 98.1 F (36.7 C) (Oral)   Resp 20   SpO2 92%   Visual Acuity Right Eye Distance:   Left Eye Distance:   Bilateral Distance:    Right Eye Near:   Left Eye Near:    Bilateral Near:     Physical Exam Vitals and nursing note reviewed.  Constitutional:      General: He is not in acute distress.    Appearance: Normal appearance. He is not ill-appearing or toxic-appearing.  HENT:     Head: Normocephalic and atraumatic.     Right Ear: Tympanic membrane, ear canal and external ear normal.     Left Ear: Tympanic membrane, ear canal and external ear normal.     Nose: Nose normal. No congestion or rhinorrhea.     Mouth/Throat:     Mouth: Mucous membranes are moist.     Pharynx: Oropharynx is clear. Posterior oropharyngeal erythema present. No oropharyngeal exudate.  Eyes:     General: No scleral icterus.    Extraocular Movements: Extraocular movements intact.  Cardiovascular:     Rate and Rhythm: Normal rate and regular rhythm.  Pulmonary:     Effort: Pulmonary effort is normal. No respiratory distress.     Breath sounds: Examination of the left-lower field reveals decreased breath sounds. Decreased breath sounds present. No wheezing, rhonchi or rales.  Abdominal:     General: Abdomen is flat. Bowel sounds are normal. There is no distension.     Palpations: Abdomen is soft.     Tenderness: There is no abdominal tenderness.  Musculoskeletal:     Cervical back: Normal range of motion and neck supple.     Right lower leg: Edema (mild erythema and warmth; 1+ pitting edema.  Homan's squeeze positive) present.  Lymphadenopathy:     Cervical: No cervical adenopathy.  Skin:    General: Skin is warm and dry.     Coloration: Skin is not jaundiced  or pale.     Findings: No erythema or rash.  Neurological:     Mental Status: He is alert and oriented to person, place, and time.      UC Treatments / Results  Labs (all labs ordered are listed, but only abnormal results are displayed) Labs Reviewed - No data to  display  EKG   Radiology DG Chest 2 View  Result Date: 05/26/2022 CLINICAL DATA:  Cough, chest pain EXAM: CHEST - 2 VIEW COMPARISON:  Chest radiograph done on 07/02/2014 and CT done on 09/01/2020 FINDINGS: Transverse diameter of heart is in the upper limits of normal. Thoracic aorta is tortuous. Increased markings are seen in both lower lung fields, more so on the left side. There is blunting of left lateral CP angle. There is no pneumothorax. IMPRESSION: There are no signs of pulmonary edema or focal pulmonary consolidation. Small left pleural effusion. Linear densities are seen in the lower lung fields, more so on the left side suggesting subsegmental atelectasis and possibly scarring. Electronically Signed   By: Elmer Picker M.D.   On: 05/26/2022 12:22    Procedures Procedures (including critical care time)  Medications Ordered in UC Medications - No data to display  Initial Impression / Assessment and Plan / UC Course  I have reviewed the triage vital signs and the nursing notes.  Pertinent labs & imaging results that were available during my care of the patient were reviewed by me and considered in my medical decision making (see chart for details).   Patient is well-appearing, normotensive, afebrile, not tachycardic, not tachypneic, oxygenating well on room air.    Right leg swelling History of DVT (deep vein thrombosis) History of pulmonary embolism Chest x-ray today shows no pulmonary edema or focal consolidation; there is a small left pleural effusion and possible subsegmental atelectasis with scarring Discussed with patient that I am concerned for pulmonary embolism, possible DVT Stat right lower  extremity ultrasound ordered for DVT rule out Strict ER precautions in the meantime  Update: Radiologist called with results with positive findings for new right lower extremity DVT'; discussed with patient and recommended further evaluation and management in the emergency room given also chest tightness, new effusion versus atelectasis; cannot rule out heart strain, pulmonary embolism at this time  Patient is in agreement to plan, already at hospital and will be escorted to emergency room by staff  The patient was given the opportunity to ask questions.  All questions answered to their satisfaction.  The patient is in agreement to this plan.    Final Clinical Impressions(s) / UC Diagnoses   Final diagnoses:  Right leg swelling  History of DVT (deep vein thrombosis)  History of pulmonary embolism     Discharge Instructions      The chest x-ray today does not show any signs of pneumonia.  It does show a small amount of fluid on your left lung.  I am concerned about the pain that you are having when you breathe and the swelling in your leg.  [facility] to [facility] immediately after leaving urgent care today.  We will call you later today with the results from the ultrasound.  If your symptoms worsen in the meantime, please call 911 or go to the ER.     ED Prescriptions   None    PDMP not reviewed this encounter.   Eulogio Bear, NP 05/26/22 1450

## 2022-05-26 NOTE — Assessment & Plan Note (Addendum)
-   Acute right lower extremity DVT and positive CT angiogram for PE  -Patient with history of PE and DVT in the past; appears to have trouble and requiring anticoagulation therapy due to price and was advised to quit treatment early (patient concluded less than 3 months of therapy based on family report).. - Patient was using a daily aspirin intermittently. -Continue heparin drip -2D echo demonstrating mild right ventricle dysfunction -2 L nasal cannula in place with a stable blood pressure and denying chest pain. -Planning to transition to oral anticoagulation therapy after completing 48 hours of treatment.   -Wean off oxygen supplementation as tolerated.

## 2022-05-26 NOTE — H&P (Signed)
History and Physical    Patient: Richard Cannon OAC:[id number] DOB: [birth year] DOA: 05/26/2022 DOS: the patient was seen and examined on 05/26/2022 PCP: Hoyt Koch, MD  Patient coming from: Home  Chief Complaint:  Chief Complaint  Patient presents with   Shortness of Breath   HPI: Richard Cannon is a 88 y.o. male with medical history significant of, BPH, DVT, deviated septum, and more presents the ED with a chief complaint of right leg swelling.  Patient reports that he has had asymmetric peripheral edema, and pleuritic chest pain.  The edema started 3 nights ago.  He noticed that his leg was hurting, and when he examined it found his right leg to be larger than his left leg.  Patient reports the pain was in his calf and it was dull and achy.  He is not sure if it was like the pain that he had before when he had a DVT or not.  Patient reports yesterday he started having pleuritic chest pain.  Pain is sharp and is worse when he coughs or takes a deep breath.  Patient reports he has had a mild cough, no hemoptysis.  Been productive of clear sputum.  He has not had any fever.  Patient reports he has felt short of breath for 3 days.  When he had clots in the past he was on Eliquis which became too expensive.  Patient reports he spoke with one of his doctors who told him it was okay to go back to just taking aspirin he made that change 6 months ago.  On review of systems patient does report 1 loose stool with no melena or hematochezia.  Patient does not smoke, does not drink, is vaccinated for COVID.  Patient is DNR. Review of Systems: As mentioned in the history of present illness. All other systems reviewed and are negative. Past Medical History:  Diagnosis Date   Acute pulmonary embolism (HCC)    Benign prostatic hypertrophy    Deviated septum    DVT (deep venous thrombosis) (HCC)    Pneumonia of lower lobe of lung    bilateral   Respiratory failure with hypoxia [facility])    Past  Surgical History:  Procedure Laterality Date   EYE SURGERY     hernia surgery  05/30/2014   SEPTOPLASTY N/A 10/05/2015   Procedure: SEPTOPLASTY;  Surgeon: Leta Baptist, MD;  Location: [place];  Service: ENT;  Laterality: N/A;   TURBINATE REDUCTION Bilateral 10/05/2015   Procedure: BILATERAL TURBINATE REDUCTION;  Surgeon: Leta Baptist, MD;  Location: [place];  Service: ENT;  Laterality: Bilateral;   Social History:  reports that he has never smoked. He has never used smokeless tobacco. He reports that he does not drink alcohol and does not use drugs.  Allergies  Allergen Reactions   Iodine    Tetracyclines & Related     Hospitalized after taking - unsure of reaction    Family History  Problem Relation Age of Onset   Heart disease Mother    Breast cancer Mother    Skin cancer Brother     Prior to Admission medications   Medication Sig Start Date End Date Taking? Authorizing Provider  apixaban (ELIQUIS) 5 MG TABS tablet Take 1 tablet (5 mg total) by mouth 2 (two) times daily. Patient not taking: Reported on 05/26/2022 08/31/21   Hoyt Koch, MD  aspirin EC 81 MG tablet Take 81 mg by mouth daily. Swallow whole.  [provider]  ciprofloxacin-dexamethasone (CIPRODEX) OTIC suspension Place 4 drops into the right ear 2 (two) times daily. 11/15/21   Hoyt Koch, MD  finasteride (PROSCAR) 5 MG tablet Take 1 tablet by mouth daily. 06/27/14   [provider]  latanoprost (XALATAN) 0.005 % ophthalmic solution  04/07/17   [provider]  Multiple Vitamin (MULTI-VITAMIN PO) Take 1 tablet by mouth daily.    [provider]  sildenafil (VIAGRA) 100 MG tablet Take 0.5-1 tablets (50-100 mg total) by mouth daily as needed for erectile dysfunction. Patient taking differently: Take 20 mg by mouth daily as needed for erectile dysfunction. 04/25/16   Hoyt Koch, MD  triamcinolone cream (KENALOG) 0.1 % Apply 1  application topically 2 (two) times daily. Patient not taking: Reported on 11/15/2021 06/27/19   Hoyt Koch, MD    Physical Exam: Vitals:   05/26/22 1915 05/26/22 2100 05/26/22 2200 05/26/22 2301  BP:  (!) 161/89 (!) 170/87 (!) 165/73  Pulse: 84 84 90   Resp: (!) 24 (!) 25 (!) 21 (!) 28  Temp:    98.1 F (36.7 C)  TempSrc:    Oral  SpO2: 95% 96% 95% 96%  Weight:    87.7 kg  Height:    5\' 10"  (1.778 m)   1.  General: Patient lying supine in bed,  no acute distress   2. Psychiatric: Alert and oriented x 3, mood and behavior normal for situation, pleasant and cooperative with exam   3. Neurologic: Speech and language are normal, face is symmetric, moves all 4 extremities voluntarily, at baseline without acute deficits on limited exam   4. HEENMT:  Head is atraumatic, normocephalic, pupils reactive to light, neck is supple, trachea is midline, mucous membranes are moist   5. Respiratory : Lungs are clear to auscultation bilaterally without wheezing, rhonchi, rales, no cyanosis, no increase in work of breathing or accessory muscle use, maintaining oxygen saturations on 2 L nasal cannula   6. Cardiovascular : Heart rate normal, rhythm is regular, no murmurs, rubs or gallops, asymmetric peripheral edema, peripheral pulses palpated   7. Gastrointestinal:  Abdomen is soft, nondistended, nontender to palpation bowel sounds active, no masses or organomegaly palpated   8. Skin:  Skin is warm, dry and intact without rashes, acute lesions, or ulcers on limited exam   9.Musculoskeletal:  No acute deformities or trauma, no asymmetry in tone, asymmetric peripheral edema, peripheral pulses palpated, no tenderness to palpation in the extremities  Data Reviewed: In the ED Temp 98.1, heart rate 82-1 01, respiratory rate 14-39 blood pressure 157/75-170/96, satting 90-96% on 2 L nasal cannula no leukocytosis with a white blood cell count of 10.1, hemoglobin 16.0 Hypokalemia at 3.4 No  troponin leak type III, 5 Negative COVID and flu CTA chest shows large bilateral PE with cardiomegaly and pulmonary edema suggestive of CHF.  Possible infarction at the left lung base. Admission requested for further management of acute PE Heparin drip started  Assessment and Plan: * Acute pulmonary embolism (HCC) - Acute PE with history of PE and DVT -Not taking blood thinner prior to arrival -Heparin drip started - Echo in the a.m. - Ultrasound bilateral DVT studies in the a.m. to assess for clot burden - Discussed with interventional radiology at [facility] in the field patient can stay here and would not likely need invasive intervention unless condition changes - Monitor on telemetry  Hypokalemia - Potassium 3.4 - 20 mEq p.o. potassium in ED - Trend  in the a.m. - Monitor on telemetry  Benign prostatic hyperplasia - Continue Proscar      Advance Care Planning:   Code Status: DNR  Consults: I recommends patient to stay here at [facility]  Family Communication: Daughter at bedside  Severity of Illness: The appropriate patient status for this patient is INPATIENT. Inpatient status is judged to be reasonable and necessary in order to provide the required intensity of service to ensure the patient's safety. The patient's presenting symptoms, physical exam findings, and initial radiographic and laboratory data in the context of their chronic comorbidities is felt to place them at high risk for further clinical deterioration. Furthermore, it is not anticipated that the patient will be medically stable for discharge from the hospital within 2 midnights of admission.   * I certify that at the point of admission it is my clinical judgment that the patient will require inpatient hospital care spanning beyond 2 midnights from the point of admission due to high intensity of service, high risk for further deterioration and high frequency of surveillance required.*  Author: Lilyan Gilford, DO 05/26/2022 11:42 PM  For on call review [email or web address].

## 2022-05-26 NOTE — ED Notes (Signed)
Per DR Z, pt ok to stay here- will transport to stepdown bed on second floor as previously planned, Einar Pheasant RN made aware.

## 2022-05-26 NOTE — ED Notes (Signed)
Patient transported to CT 

## 2022-05-26 NOTE — Discharge Instructions (Signed)
The chest x-ray today does not show any signs of pneumonia.  It does show a small amount of fluid on your left lung.  I am concerned about the pain that you are having when you breathe and the swelling in your leg.  Please go to [facility] immediately after leaving urgent care today.  We will call you later today with the results from the ultrasound.  If your symptoms worsen in the meantime, please call 911 or go to the ER.

## 2022-05-26 NOTE — ED Provider Triage Note (Signed)
Emergency Medicine Provider Triage Evaluation Note  Richard Cannon , a 88 y.o. male  was evaluated in triage.  Pt complains of swelling to right leg and chest pain with deep breath. Sent by UC for a CXR. SHOB x 3 days, leg swelling x 3 legs. History of PE, no longer on Eliquis (too expensive).  + DVT study OP today Review of Systems  Positive: As above Negative: As above  Physical Exam  There were no vitals taken for this visit. Gen:   Awake, no distress   Resp:  Normal effort  MSK:   Moves extremities without difficulty  Other:  Right calf TTP with swelling  Medical Decision Making  Medically screening exam initiated at 2:48 PM.  Appropriate orders placed.  Cyndie Mull was informed that the remainder of the evaluation will be completed by another provider, this initial triage assessment does not replace that evaluation, and the importance of remaining in the ED until their evaluation is complete.     Tacy Learn, PA-C 05/26/22 1450

## 2022-05-26 NOTE — ED Notes (Signed)
Called Central Scheduling and pt is to go to [facility] post discharge from UC for [place]. NP and PT aware.

## 2022-05-26 NOTE — Progress Notes (Addendum)
ANTICOAGULATION CONSULT NOTE - Initial Consult  Pharmacy Consult for Heparin Indication: pulmonary embolus  Allergies  Allergen Reactions   Iodine    Tetracyclines & Related     Hospitalized after taking - unsure of reaction    Patient Measurements: Height: 5\' 10"  (177.8 cm) Weight: 88.5 kg (195 lb) IBW/kg (Calculated) : 73 HEPARIN DW (KG): 88.5   Vital Signs: Temp: 98.1 F (36.7 C) (01/04 1138) Temp Source: Oral (01/04 1138) BP: 166/85 (01/04 1515) Pulse Rate: 101 (01/04 1515)  Labs: Recent Labs    05/26/22 1514  HGB 16.0  HCT 48.8  PLT 345    CrCl cannot be calculated (Patient's most recent lab result is older than the maximum 21 days allowed.).   Medical History: Past Medical History:  Diagnosis Date   Acute pulmonary embolism ([place])    Benign prostatic hypertrophy    Deviated septum    DVT (deep venous thrombosis) (HCC)    Pneumonia of lower lobe of lung    bilateral   Respiratory failure with hypoxia (HCC)     Medications:  See med rec  Assessment: Patient presented today for chest congestion and shortness of breath with walking. 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 Patient stopped Eliquis about 6 months ago by hematologist . Part of the reason patient wanted to stop eliquis because it was too expensive.($400/mo he said). Ultrasound of legs shows extensive DVT throughout right lower leg. Patient also with PE per MD Pharmacy asked to start heparin  Goal of Therapy:  Heparin level 0.3-0.7 units/ml Monitor platelets by anticoagulation protocol: Yes   Plan:  Give 5000 units bolus x 1 Start heparin infusion at 1400 units/hr Check anti-Xa level in ~6-8 hours and daily while on heparin Continue to monitor H&H and platelets  Isac Sarna, BS Pharm D, BCPS Clinical Pharmacist 05/26/2022,3:46 PM

## 2022-05-27 ENCOUNTER — Inpatient Hospital Stay (HOSPITAL_COMMUNITY): Payer: Medicare Other

## 2022-05-27 ENCOUNTER — Other Ambulatory Visit (HOSPITAL_COMMUNITY): Payer: Self-pay

## 2022-05-27 ENCOUNTER — Ambulatory Visit: Payer: Medicare Other | Admitting: Internal Medicine

## 2022-05-27 DIAGNOSIS — J9601 Acute respiratory failure with hypoxia: Secondary | ICD-10-CM | POA: Diagnosis not present

## 2022-05-27 DIAGNOSIS — I2699 Other pulmonary embolism without acute cor pulmonale: Secondary | ICD-10-CM | POA: Diagnosis not present

## 2022-05-27 DIAGNOSIS — I2609 Other pulmonary embolism with acute cor pulmonale: Secondary | ICD-10-CM

## 2022-05-27 DIAGNOSIS — N401 Enlarged prostate with lower urinary tract symptoms: Secondary | ICD-10-CM | POA: Diagnosis not present

## 2022-05-27 DIAGNOSIS — E876 Hypokalemia: Secondary | ICD-10-CM | POA: Diagnosis not present

## 2022-05-27 LAB — COMPREHENSIVE METABOLIC PANEL
ALT: 24 U/L (ref 0–44)
AST: 26 U/L (ref 15–41)
Albumin: 3.2 g/dL — ABNORMAL LOW (ref 3.5–5.0)
Alkaline Phosphatase: 80 U/L (ref 38–126)
Anion gap: 7 (ref 5–15)
BUN: 14 mg/dL (ref 8–23)
CO2: 23 mmol/L (ref 22–32)
Calcium: 8.1 mg/dL — ABNORMAL LOW (ref 8.9–10.3)
Chloride: 105 mmol/L (ref 98–111)
Creatinine, Ser: 0.88 mg/dL (ref 0.61–1.24)
GFR, Estimated: >60 mL/min (ref >60)
Glucose, Bld: 119 mg/dL — ABNORMAL HIGH (ref 70–99)
Potassium: 4.1 mmol/L (ref 3.5–5.1)
Sodium: 135 mmol/L (ref 135–145)
Total Bilirubin: 0.9 mg/dL (ref 0.3–1.2)
Total Protein: 6.2 g/dL — ABNORMAL LOW (ref 6.5–8.1)

## 2022-05-27 LAB — MAGNESIUM: Magnesium: 1.8 mg/dL (ref 1.7–2.4)

## 2022-05-27 LAB — CBC
HCT: 44 % (ref 39.0–52.0)
Hemoglobin: 14.8 g/dL (ref 13.0–17.0)
MCH: 32.9 pg (ref 26.0–34.0)
MCHC: 33.6 g/dL (ref 30.0–36.0)
MCV: 97.8 fL (ref 80.0–100.0)
Platelets: 311 10*3/uL (ref 150–400)
RBC: 4.5 MIL/uL (ref 4.22–5.81)
RDW: 12.8 % (ref 11.5–15.5)
WBC: 9.4 10*3/uL (ref 4.0–10.5)
nRBC: 0 % (ref 0.0–0.2)

## 2022-05-27 LAB — HEPARIN LEVEL (UNFRACTIONATED)
Heparin Unfractionated: 0.38 IU/mL (ref 0.30–0.70)
Heparin Unfractionated: 0.55 IU/mL (ref 0.30–0.70)

## 2022-05-27 LAB — ECHOCARDIOGRAM COMPLETE
Area-P 1/2: 3.17 cm2
Height: 70 in
MV M vel: 1.02 m/s
MV Peak grad: 4.1 mmHg
S' Lateral: 2.3 cm
Weight: 3093.49 oz

## 2022-05-27 LAB — MRSA NEXT GEN BY PCR, NASAL: MRSA by PCR Next Gen: NOT DETECTED

## 2022-05-27 NOTE — Progress Notes (Signed)
Patient complaining of having difficulty urinating while lying in bed. Positioned patient to sitting position in bed and patient still unable to void. Bladder scan performed with result of 348ml. Dr Dyann Kief made aware and per verbal from Dr Dyann Kief, Divide to stand patient up right at bedside to void WITH assistance. Patient was able to void standing right next to bed with assistance.

## 2022-05-27 NOTE — Progress Notes (Signed)
ANTICOAGULATION CONSULT NOTE - Follow Up Consult  Pharmacy Consult for heparin Indication:  PE/DVT  Labs: Recent Labs    05/26/22 1514 05/26/22 1517 05/26/22 1709 05/26/22 2352  HGB 16.0  --   --   --   HCT 48.8  --   --   --   PLT 345  --   --   --   APTT  --  30  --   --   LABPROT  --  13.5  --   --   INR  --  1.0  --   --   HEPARINUNFRC  --   --   --  0.38  CREATININE 1.06  --   --   --   TROPONINIHS 3  --  5  --     Assessment: 88yo male therapeutic on heparin with initial dosing but at lower end of goal and would prefer higher level given large PE and extensive DVT; no infusion issues or signs of bleeding per RN.  Goal of Therapy:  Heparin level 0.3-0.7 units/ml   Plan:  Will increase heparin infusion by ~1 unit/kg/hr to 1500 units/hr and check level in 8 hours.    Wynona Neat, PharmD, BCPS  05/27/2022,12:25 AM

## 2022-05-27 NOTE — Progress Notes (Signed)
[facility] for Heparin Indication: pulmonary embolus  Allergies  Allergen Reactions   Iodine    Tetracyclines & Related     Hospitalized after taking - unsure of reaction    Patient Measurements: Height: 5\' 10"  (177.8 cm) Weight: 87.7 kg (193 lb 5.5 oz) IBW/kg (Calculated) : 73 HEPARIN DW (KG): 87.7   Vital Signs: Temp: 98.6 F (37 C) (01/05 0743) Temp Source: Oral (01/05 0743) BP: 134/69 (01/05 0700) Pulse Rate: 65 (01/05 0700)  Labs: Recent Labs    05/26/22 1514 05/26/22 1517 05/26/22 1709 05/26/22 2352 05/27/22 0332  HGB 16.0  --   --   --  14.8  HCT 48.8  --   --   --  44.0  PLT 345  --   --   --  311  APTT  --  30  --   --   --   LABPROT  --  13.5  --   --   --   INR  --  1.0  --   --   --   HEPARINUNFRC  --   --   --  0.38  --   CREATININE 1.06  --   --   --  0.88  TROPONINIHS 3  --  5  --   --      Estimated Creatinine Clearance: 68.5 mL/min (by C-G formula based on SCr of 0.88 mg/dL).   Medical History: Past Medical History:  Diagnosis Date   Acute pulmonary embolism ([place])    Benign prostatic hypertrophy    Deviated septum    DVT (deep venous thrombosis) (HCC)    Pneumonia of lower lobe of lung    bilateral   Respiratory failure with hypoxia (HCC)     Assessment: Patient presented today for chest congestion and shortness of breath with walking. 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 Patient stopped Eliquis about 6 months ago by hematologist and was placed on aspirin.   Discussed plan with team and will continue heparin today and plan to transition to apixaban this weekend.   Heparin level at goal this morning on 1500 units/hr. CBC within normal limits. No bleeding or IV issues noted.  Goal of Therapy:  Heparin level 0.3-0.7 units/ml Monitor platelets by anticoagulation protocol: Yes   Plan:  Continue heparin at 1500 units/hr Daily heparin level and cbc  Erin Hearing  PharmD., BCPS Clinical Pharmacist 05/27/2022 8:28 AM

## 2022-05-27 NOTE — Progress Notes (Signed)
  Transition of Care [facility]) Screening Note   Patient Details  Name: Richard Cannon Date of Birth: 06-06-1934   Transition of Care [facility]) CM/SW Contact:    Ihor Gully, LCSW Phone Number: 05/27/2022, 2:17 PM    Transition of Care Department [facility]) has reviewed patient and no TOC needs have been identified at this time. We will continue to monitor patient advancement through interdisciplinary progression rounds. If new patient transition needs arise, please place a TOC consult.

## 2022-05-27 NOTE — Care Management Important Message (Signed)
Important Message  Patient Details  Name: Richard Cannon MRN: [id number] Date of Birth: [birth year]   Medicare Important Message Given:  Yes     Tommy Medal 05/27/2022, 1:16 PM

## 2022-05-27 NOTE — Progress Notes (Signed)
  Echocardiogram 2D Echocardiogram has been performed.  Richard Cannon 05/27/2022, 10:26 AM

## 2022-05-27 NOTE — TOC Benefit Eligibility Note (Signed)
Patient Advocate Encounter  Insurance verification completed.    The patient is currently admitted and upon discharge could be taking Eliquis 5 mg.  The current 30 day co-pay is $45.00.   The patient is insured through [employer] Part D   Eulogia Dismore, CPHT Pharmacy Patient Advocate Specialist [facility] Patient Advocate Team Direct Number: [phone]  Fax: [phone]       

## 2022-05-27 NOTE — Progress Notes (Signed)
Progress Note   Patient: Richard Cannon:[id number] DOB: 1935-02-27 DOA: 05/26/2022     1 DOS: the patient was seen and examined on 05/27/2022   Brief hospital course: As per H&P written by Dr.Zierle-Ghosh On 05/26/2022 Richard Cannon is a 88 y.o. male with medical history significant of, BPH, DVT, deviated septum, and more presents the ED with a chief complaint of right leg swelling.  Patient reports that he has had asymmetric peripheral edema, and pleuritic chest pain.  The edema started 3 nights ago.  He noticed that his leg was hurting, and when he examined it found his right leg to be larger than his left leg.  Patient reports the pain was in his calf and it was dull and achy.  He is not sure if it was like the pain that he had before when he had a DVT or not.  Patient reports yesterday he started having pleuritic chest pain.  Pain is sharp and is worse when he coughs or takes a deep breath.  Patient reports he has had a mild cough, no hemoptysis.  Been productive of clear sputum.  He has not had any fever.  Patient reports he has felt short of breath for 3 days.  When he had clots in the past he was on Eliquis which became too expensive.  Patient reports he spoke with one of his doctors who told him it was okay to go back to just taking aspirin he made that change 6 months ago.  On review of systems patient does report 1 loose stool with no melena or hematochezia.   Patient does not smoke, does not drink, is vaccinated for COVID.  Patient is DNR.  Assessment and Plan: * Acute pulmonary embolism ([place]) - Acute right lower extremity DVT and positive CT angiogram for PE  -Patient with history of PE and DVT in the past; appears to have trouble and requiring anticoagulation therapy due to price and was advised to quit treatment early (patient concluded less than 3 months of therapy based on family report).. - Patient was using a daily aspirin intermittently. -Continue heparin drip -2D echo  demonstrating mild right ventricle dysfunction -2 L nasal cannula in place with a stable blood pressure and denying chest pain. -Planning to transition to oral anticoagulation therapy after completing 48 hours of treatment.   -Wean off oxygen supplementation as tolerated.  Hypokalemia -Potassium 3.4 at time of admission -Repleted and is stable currently -Continue telemetry monitoring -Follow electrolytes trend.   Benign prostatic hyperplasia - Continue Proscar -Patient denies urinary retention symptoms.  Chronic diastolic heart failure -Low-sodium diet discussed with patient -Maintain adequate hydration -Follow daily weights and strict I's and O's. -Condition is overall compensated.  Subjective:  No chest pain, no nausea, no vomiting, no palpitations; 2 L nasal cannula in place and expressing feeling slightly short winded with activity.  Patient is afebrile.  Physical Exam: Vitals:   05/27/22 1500 05/27/22 1600 05/27/22 1700 05/27/22 1800  BP: (!) 147/73 (!) 150/71 (!) 168/85 (!) 153/62  Pulse: 73 69 78 80  Resp:  18 19   Temp:      TempSrc:      SpO2: 93% 92% 93% 93%  Weight:      Height:       General exam: Alert, awake, oriented x 3; patient reports no chest pain, no nausea, no vomiting.  Slightly short winded with exertion.  Afebrile Respiratory system: No wheezing, no crackles, no using accessory muscles.  Good  saturation appreciated on 2 L nasal cannula supplementation Cardiovascular system: Rate controlled, no rubs, no gallops, no JVD. Gastrointestinal system: Abdomen is nondistended, soft and nontender. No organomegaly or masses felt. Normal bowel sounds heard. Central nervous system: Alert and oriented. No focal neurological deficits. Extremities: No cyanosis or clubbing; patient denying pain in his lower extremities.  Right lower extremity bigger than his left lower extremity (patient expressed that is not new). Skin: No petechiae. Psychiatry: Judgement and  insight appear normal. Mood & affect appropriate.   Data Reviewed: CBC: WBCs 9.4, hemoglobin 14.8 and platelet count 311 K Comprehensive metabolic panel: Sodium 960, potassium 4.1, chloride 105, bicarb 23, BUN 14, creatinine 0.88; normal LFTs. Magnesium: 1.8  2D echo: 1. Left ventricular ejection fraction, by estimation, is 60 to 65%. The  left ventricle has normal function. The left ventricle has no regional  wall motion abnormalities. Left ventricular diastolic parameters are  consistent with Grade I diastolic  dysfunction (impaired relaxation).   2. Right ventricular systolic function is mildly reduced. The right  ventricular size is normal. There is mildly elevated pulmonary artery  systolic pressure. The estimated right ventricular systolic pressure is  45.4 mmHg.   3. The mitral valve is grossly normal. No evidence of mitral valve  regurgitation.   4. The aortic valve is tricuspid. Aortic valve regurgitation is not  visualized.   5. Aortic dilatation noted. There is mild dilatation of the ascending  aorta, measuring 37 mm.   6. The inferior vena cava is dilated in size with <50% respiratory  variability, suggesting right atrial pressure of 15 mmHg.   Right lower extremity Dopplers: Extensive acute DVT throughout right lower extremity appreciated.  There is a short segment of chronic thrombosis involving the proximal right femoral vein.  Family Communication: None at bedside.  Disposition: Status is: Inpatient Remains inpatient appropriate because: Continue treatment with IV anticoagulation therapy as part of treatment for acute pulmonary embolism.   Planned Discharge Destination: Home   Time spent: 50 minutes  Author: Barton Dubois, MD 05/27/2022 6:12 PM  For on call review [email or web address].

## 2022-05-28 DIAGNOSIS — I5032 Chronic diastolic (congestive) heart failure: Secondary | ICD-10-CM

## 2022-05-28 DIAGNOSIS — J9601 Acute respiratory failure with hypoxia: Secondary | ICD-10-CM | POA: Diagnosis not present

## 2022-05-28 DIAGNOSIS — N401 Enlarged prostate with lower urinary tract symptoms: Secondary | ICD-10-CM | POA: Diagnosis not present

## 2022-05-28 DIAGNOSIS — I10 Essential (primary) hypertension: Secondary | ICD-10-CM

## 2022-05-28 DIAGNOSIS — I2699 Other pulmonary embolism without acute cor pulmonale: Secondary | ICD-10-CM | POA: Diagnosis not present

## 2022-05-28 DIAGNOSIS — I82411 Acute embolism and thrombosis of right femoral vein: Secondary | ICD-10-CM

## 2022-05-28 DIAGNOSIS — E876 Hypokalemia: Secondary | ICD-10-CM | POA: Diagnosis not present

## 2022-05-28 LAB — CBC
HCT: 45 % (ref 39.0–52.0)
Hemoglobin: 15.1 g/dL (ref 13.0–17.0)
MCH: 32.8 pg (ref 26.0–34.0)
MCHC: 33.6 g/dL (ref 30.0–36.0)
MCV: 97.6 fL (ref 80.0–100.0)
Platelets: 319 10*3/uL (ref 150–400)
RBC: 4.61 MIL/uL (ref 4.22–5.81)
RDW: 12.9 % (ref 11.5–15.5)
WBC: 8.2 10*3/uL (ref 4.0–10.5)
nRBC: 0 % (ref 0.0–0.2)

## 2022-05-28 LAB — HEPARIN LEVEL (UNFRACTIONATED): Heparin Unfractionated: 0.44 IU/mL (ref 0.30–0.70)

## 2022-05-28 MED ORDER — APIXABAN 5 MG PO TABS: ORAL_TABLET | ORAL | 5 refills | Status: DC

## 2022-05-28 MED ORDER — ACETAMINOPHEN 500 MG PO TABS: 500.0000 mg | ORAL_TABLET | Freq: Four times a day (QID) | ORAL | Status: AC | PRN

## 2022-05-28 MED ORDER — APIXABAN 5 MG PO TABS
10.0000 mg | ORAL_TABLET | Freq: Two times a day (BID) | ORAL | Status: DC
  Administered 2022-05-28: 10 mg via ORAL
  Filled 2022-05-28: qty 2

## 2022-05-28 MED ORDER — APIXABAN 5 MG PO TABS: 5.0000 mg | ORAL_TABLET | Freq: Two times a day (BID) | ORAL | Status: DC

## 2022-05-28 NOTE — Assessment & Plan Note (Signed)
-  Patient met criteria for acute respiratory failure with hypoxia secondary to pulmonary embolism at time of admission requiring 2 L nasal cannula supplementation at time of presentation. -After receiving treatment patient stabilize, no oxygen requirement was appreciated.  At discharge good saturation on room air appreciated.

## 2022-05-28 NOTE — Discharge Summary (Signed)
Physician Discharge Summary   Patient: Richard Cannon MRN: [id number] DOB: Jan 04, 1935  Admit date:     05/26/2022  Discharge date: 05/28/22  Discharge Physician: Vassie Loll   PCP: Myrlene Broker, MD   Recommendations at discharge:  Repeat basic metabolic panel to follow ultralights and renal function Reassess blood pressure and adjust antihypertensive treatment as needed Repeat CBC to follow hemoglobin trend/stability.  Discharge Diagnoses: Principal Problem:   Acute pulmonary embolism (HCC) Active Problems:   Benign prostatic hyperplasia   Hypokalemia   Acute respiratory failure with hypoxia (HCC)   Acute deep vein thrombosis (DVT) of femoral vein of right lower extremity (HCC)   Chronic diastolic CHF (congestive heart failure) (HCC)   Primary hypertension  Hospital Course: As per H&P written by Dr.Zierle-Ghosh On 05/26/2022 Richard Cannon is a 88 y.o. male with medical history significant of, BPH, DVT, deviated septum, and more presents the ED with a chief complaint of right leg swelling.  Patient reports that he has had asymmetric peripheral edema, and pleuritic chest pain.  The edema started 3 nights ago.  He noticed that his leg was hurting, and when he examined it found his right leg to be larger than his left leg.  Patient reports the pain was in his calf and it was dull and achy.  He is not sure if it was like the pain that he had before when he had a DVT or not.  Patient reports yesterday he started having pleuritic chest pain.  Pain is sharp and is worse when he coughs or takes a deep breath.  Patient reports he has had a mild cough, no hemoptysis.  Been productive of clear sputum.  He has not had any fever.  Patient reports he has felt short of breath for 3 days.  When he had clots in the past he was on Eliquis which became too expensive.  Patient reports he spoke with one of his doctors who told him it was okay to go back to just taking aspirin he made that change 6  months ago.  On review of systems patient does report 1 loose stool with no melena or hematochezia.   Patient does not smoke, does not drink, is vaccinated for COVID.  Patient is DNR.  Assessment and Plan: * Acute pulmonary embolism (HCC) - Acute right lower extremity DVT and positive CT angiogram for PE  -Patient with history of PE and DVT in the past; appears to have trouble acquiring anticoagulation therapy due to price in the past. - Patient was using a daily aspirin intermittently prior to admission.. -Treated with 48 hours of heparin drip. -2D echo demonstrating mild right ventricle dysfunction; but patient hemodynamically stable, not requiring oxygen supplementation and with a stable blood pressure at discharge. -Case discussed with pulmonology service who deemed him appropriate for discharge on oral Eliquis at this moment indefinitely (as this is his second event of DVT/PE).  Primary hypertension -Patient reports good control of blood pressure while using Proscar only -Heart healthy/low-sodium diet discussed with patient -Will recommend reassessment of his blood pressure at follow-up visit with further adjustment to antihypertensive regimen as required.  Chronic diastolic CHF (congestive heart failure) (HCC) -Continue low-sodium diet, daily weights, and control of hypertension. -Condition overall compensated and no signs of fluid overload appreciated.   Acute deep vein thrombosis (DVT) of femoral vein of right lower extremity (HCC) -Right lower extremity acute DVT appreciated -Continue treatment with anticoagulation therapy and follow-up as an outpatient with vascular service.  Acute respiratory failure with hypoxia (HCC) -Patient met criteria for acute respiratory failure with hypoxia secondary to pulmonary embolism at time of admission requiring 2 L nasal cannula supplementation at time of presentation. -After receiving treatment patient stabilize, no oxygen requirement was  appreciated.  At discharge good saturation on room air appreciated.  Hypokalemia - Repleted and within normal limits at discharge -Reassess electrolytes trend and follow-up visit.   Benign prostatic hyperplasia -Continue Proscar -Patient denies urinary retention symptoms.   Consultants: Pulmonology service curbside. Procedures performed: See below for x-ray report; 2D echo. Disposition: Home Diet recommendation: Heart healthy/low-sodium diet.  DISCHARGE MEDICATION: Allergies as of 05/28/2022       Reactions   Iodine    Tetracyclines & Related    Hospitalized after taking - unsure of reaction        Medication List     STOP taking these medications    aspirin EC 81 MG tablet   ciprofloxacin-dexamethasone OTIC suspension Commonly known as: Ciprodex   triamcinolone cream 0.1 % Commonly known as: KENALOG       TAKE these medications    acetaminophen 500 MG tablet Commonly known as: TYLENOL Take 1 tablet (500 mg total) by mouth every 6 (six) hours as needed for mild pain or headache (or Fever >/= 101).   apixaban 5 MG Tabs tablet Commonly known as: ELIQUIS Take 2 tablets by mouth twice a day for 7 days; then 1 tablet by mouth twice a day indefinitely. What changed:  how much to take how to take this when to take this additional instructions   finasteride 5 MG tablet Commonly known as: PROSCAR Take 1 tablet by mouth daily.   latanoprost 0.005 % ophthalmic solution Commonly known as: XALATAN   MULTI-VITAMIN PO Take 1 tablet by mouth daily.   sildenafil 100 MG tablet Commonly known as: Viagra Take 0.5-1 tablets (50-100 mg total) by mouth daily as needed for erectile dysfunction. What changed: how much to take        Follow-up Information     Hoyt Koch, MD. Schedule an appointment as soon as possible for a visit in 10 day(s).   Specialty: Internal Medicine Contact information: [place]  [phone]                Discharge Exam: Richard Cannon Weights   05/26/22 1453 05/26/22 2301  Weight: 88.5 kg 87.7 kg   General exam: Alert, awake, oriented x 3; speaking in full sentences; denies any chest pain or shortness of breath.  Good saturation on room air. Respiratory system: Clear to auscultation. Respiratory effort normal. Cardiovascular system:RRR. No murmurs, rubs, gallops.  No JVD. Gastrointestinal system: Abdomen is nondistended, soft and nontender. No organomegaly or masses felt. Normal bowel sounds heard. Central nervous system: Alert and oriented. No focal neurological deficits. Extremities: No cyanosis or clubbing.  Right lower extremity larger in comparison to his left lower extremity. Skin: No petechiae. Psychiatry: Judgement and insight appear normal. Mood & affect appropriate.    Condition at discharge: Stable and improved.  The results of significant diagnostics from this hospitalization (including imaging, microbiology, ancillary and laboratory) are listed below for reference.   Imaging Studies: ECHOCARDIOGRAM COMPLETE  Result Date: 05/27/2022    ECHOCARDIOGRAM REPORT   Patient Name:   Richard Cannon Date of Exam: 05/27/2022 Medical Rec #:  [id number]        Height:       70.0 in Accession #:    [id number]  Weight:       193.3 lb Date of Birth:  1934-07-04        BSA:          2.058 m Patient Age:    49 years         BP:           167/78 mmHg Patient Gender: M                HR:           78 bpm. Exam Location:  [facility] Procedure: 2D Echo, Cardiac Doppler and Color Doppler Indications:    Pulmonary Embolus I26.09  History:        Patient has no prior history of Echocardiogram examinations.                 P.E., Signs/Symptoms:Edema; Risk Factors:Non-Smoker.  Sonographer:    Greer Pickerel Referring Phys: [id number] ASIA B Underwood  Sonographer Comments: Technically difficult study due to poor echo windows. Image acquisition challenging due to patient  body habitus and Image acquisition challenging due to respiratory motion. IMPRESSIONS  1. Left ventricular ejection fraction, by estimation, is 60 to 65%. The left ventricle has normal function. The left ventricle has no regional wall motion abnormalities. Left ventricular diastolic parameters are consistent with Grade I diastolic dysfunction (impaired relaxation).  2. Right ventricular systolic function is mildly reduced. The right ventricular size is normal. There is mildly elevated pulmonary artery systolic pressure. The estimated right ventricular systolic pressure is Q000111Q mmHg.  3. The mitral valve is grossly normal. No evidence of mitral valve regurgitation.  4. The aortic valve is tricuspid. Aortic valve regurgitation is not visualized.  5. Aortic dilatation noted. There is mild dilatation of the ascending aorta, measuring 37 mm.  6. The inferior vena cava is dilated in size with <50% respiratory variability, suggesting right atrial pressure of 15 mmHg. Comparison(s): No prior Echocardiogram. FINDINGS  Left Ventricle: Left ventricular ejection fraction, by estimation, is 60 to 65%. The left ventricle has normal function. The left ventricle has no regional wall motion abnormalities. The left ventricular internal cavity size was normal in size. There is  no left ventricular hypertrophy. Left ventricular diastolic parameters are consistent with Grade I diastolic dysfunction (impaired relaxation). Right Ventricle: The right ventricular size is normal. No increase in right ventricular wall thickness. Right ventricular systolic function is mildly reduced. There is mildly elevated pulmonary artery systolic pressure. The tricuspid regurgitant velocity  is 2.68 m/s, and with an assumed right atrial pressure of 15 mmHg, the estimated right ventricular systolic pressure is Q000111Q mmHg. Left Atrium: Left atrial size was normal in size. Right Atrium: Right atrial size was normal in size. Pericardium: There is no evidence of  pericardial effusion. Mitral Valve: The mitral valve is grossly normal. No evidence of mitral valve regurgitation. Tricuspid Valve: The tricuspid valve is grossly normal. Tricuspid valve regurgitation is trivial. Aortic Valve: The aortic valve is tricuspid. Aortic valve regurgitation is not visualized. Pulmonic Valve: The pulmonic valve was grossly normal. Pulmonic valve regurgitation is trivial. Aorta: The aortic root is normal in size and structure and aortic dilatation noted. There is mild dilatation of the ascending aorta, measuring 37 mm. Venous: The inferior vena cava is dilated in size with less than 50% respiratory variability, suggesting right atrial pressure of 15 mmHg. IAS/Shunts: No atrial level shunt detected by color flow Doppler.  LEFT VENTRICLE PLAX 2D LVIDd:         4.00 cm  Diastology LVIDs:         2.30 cm   LV e' medial:    5.77 cm/s LV PW:         1.00 cm   LV E/e' medial:  11.2 LV IVS:        0.80 cm   LV e' lateral:   7.07 cm/s LVOT diam:     2.00 cm   LV E/e' lateral: 9.1 LV SV:         65 LV SV Index:   32 LVOT Area:     3.14 cm  RIGHT VENTRICLE RV S prime:     20.40 cm/s TAPSE (M-mode): 1.8 cm LEFT ATRIUM           Index        RIGHT ATRIUM           Index LA diam:      3.00 cm 1.46 cm/m   RA Area:     11.60 cm LA Vol (A2C): 27.5 ml 13.37 ml/m  RA Volume:   21.00 ml  10.21 ml/m LA Vol (A4C): 56.9 ml 27.65 ml/m  AORTIC VALVE LVOT Vmax:   111.00 cm/s LVOT Vmean:  72.700 cm/s LVOT VTI:    0.207 m  AORTA Ao Root diam: 3.50 cm Ao Asc diam:  3.70 cm MITRAL VALVE                TRICUSPID VALVE MV Area (PHT): 3.17 cm     TR Peak grad:   28.7 mmHg MV Decel Time: 239 msec     TR Vmax:        268.00 cm/s MR Peak grad: 4.1 mmHg MR Vmax:      101.75 cm/s   SHUNTS MV E velocity: 64.50 cm/s   Systemic VTI:  0.21 m MV A velocity: 101.00 cm/s  Systemic Diam: 2.00 cm MV E/A ratio:  0.64 Nona Dell MD Electronically signed by Nona Dell MD Signature Date/Time: 05/27/2022/11:30:14 AM    Final     CT Angio Chest PE W/Cm &/Or Wo Cm  Result Date: 05/26/2022 CLINICAL DATA:  SOB, chest pain, DVT. EXAM: CT ANGIOGRAPHY CHEST WITH CONTRAST TECHNIQUE: Multidetector CT imaging of the chest was performed using the standard protocol during bolus administration of intravenous contrast. Multiplanar CT image reconstructions and MIPs were obtained to evaluate the vascular anatomy. RADIATION DOSE REDUCTION: This exam was performed according to the departmental dose-optimization program which includes automated exposure control, adjustment of the mA and/or kV according to patient size and/or use of iterative reconstruction technique. CONTRAST:  75 mL OMNIPAQUE IOHEXOL 350 MG/ML SOLN COMPARISON:  09/01/2020 FINDINGS: Cardiovascular: Mild cardiomegaly. Large filling defects in central pulmonary arteries extending to branch pulmonary arteries consistent with extensive bilateral PE. No aortic aneurysm or dissection identified. No pericardial effusion. Mediastinum/Nodes: No enlarged mediastinal, hilar, or axillary lymph nodes. Thyroid gland, trachea, and esophagus demonstrate no significant findings. Lungs/Pleura: Pulmonary interstitial prominence identified consistent with interstitial edema. There is alveolar consolidation or volume loss in the left base. This is likely to be infarction in the setting of PE. Small left-sided pleural effusion. Minimal right basilar subsegmental atelectasis. Upper Abdomen: No acute abnormality. Musculoskeletal: Thoracic degenerative changes. No acute osseous abnormalities identified. Review of the MIP images confirms the above findings. IMPRESSION: 1. Large bilateral pulmonary emboli. 2. Prominent mild cardiomegaly and evidence of interstitial pulmonary edema suggestive of CHF or volume overload. 3. Alveolar process left base consistent with infarction. Differential pneumonia or volume loss. Findings were discussed  with and acknowledged by Dr. Melina Copa at 6:50 p.m. Electronically Signed   By:  Sammie Bench M.D.   On: 05/26/2022 18:56   US Venous Img Lower Unilateral Right  Result Date: 05/26/2022 CLINICAL DATA:  RIGHT lower extremity swelling and pain for 2 days, history of deep venous thrombosis and pulmonary embolism EXAM: RIGHT LOWER EXTREMITY VENOUS DOPPLER ULTRASOUND TECHNIQUE: Gray-scale sonography with compression, as well as color and duplex ultrasound, were performed to evaluate the deep venous system(s) from the level of the common femoral vein through the popliteal and proximal calf veins. COMPARISON:  None Available. FINDINGS: VENOUS Extensive hypoechoic thrombus throughout the deep venous system of the RIGHT lower extremity. This includes distal common femoral vein, profundus femoral vein, femoral vein, and popliteal vein. Additional thrombus is seen within the RIGHT calf veins. Observed vessels are noncompressible and demonstrate absent spontaneous venous flow. A short segment of the proximal RIGHT femoral vein shows more hyperechoic thrombus which likely represents sequela of prior thrombosis. Limited views of the contralateral common femoral vein are unremarkable. OTHER None. Limitations: none IMPRESSION: Extensive acute deep venous thrombosis throughout the RIGHT lower extremity. 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 Critical Value/emergent results were called by telephone at the time of interpretation on 05/26/2022 at 1450 hours to provider [facility] , who verbally acknowledged these results. Per provider request, patient taken to emergency room. Electronically Signed   By: Lavonia Dana M.D.   On: 05/26/2022 15:06   DG Chest 2 View  Result Date: 05/26/2022 CLINICAL DATA:  Cough, chest pain EXAM: CHEST - 2 VIEW COMPARISON:  Chest radiograph done on 07/02/2014 and CT done on 09/01/2020 FINDINGS: Transverse diameter of heart is in the upper limits of normal. Thoracic aorta is tortuous. Increased markings are seen in both lower lung  fields, more so on the left side. There is blunting of left lateral CP angle. There is no pneumothorax. IMPRESSION: There are no signs of pulmonary edema or focal pulmonary consolidation. Small left pleural effusion. Linear densities are seen in the lower lung fields, more so on the left side suggesting subsegmental atelectasis and possibly scarring. Electronically Signed   By: Elmer Picker M.D.   On: 05/26/2022 12:22    Microbiology: Results for orders placed or performed during the hospital encounter of 05/26/22  Resp panel by RT-PCR (RSV, Flu A&B, Covid) Anterior Nasal Swab     Status: None   Collection Time: 05/26/22  3:23 PM   Specimen: Anterior Nasal Swab  Result Value Ref Range Status   SARS Coronavirus 2 by RT PCR NEGATIVE NEGATIVE Final    Comment: (NOTE) SARS-CoV-2 target nucleic acids are NOT DETECTED.  The SARS-CoV-2 RNA is generally detectable in upper respiratory specimens during the acute phase of infection. The lowest concentration of SARS-CoV-2 viral copies this assay can detect is 138 copies/mL. A negative result does not preclude SARS-Cov-2 infection and should not be used as the sole basis for treatment or other patient management decisions. A negative result may occur with  improper specimen collection/handling, submission of specimen other than nasopharyngeal swab, presence of viral mutation(s) within the areas targeted by this assay, and inadequate number of viral copies(<138 copies/mL). A negative result must be combined with clinical observations, patient history, and epidemiological information. The expected result is Negative.  Fact Sheet for Patients:  EntrepreneurPulse.com.au  Fact Sheet for Healthcare Providers:  IncredibleEmployment.be  This test is no t yet approved or cleared by the [place] and  has been authorized for detection and/or diagnosis of SARS-CoV-2 by FDA under an Emergency Use  Authorization (EUA). This EUA will remain  in effect (meaning this test can be used) for the duration of the COVID-19 declaration under Section 564(b)(1) of the Act, 21 U.S.C.section 360bbb-3(b)(1), unless the authorization is terminated  or revoked sooner.       Influenza A by PCR NEGATIVE NEGATIVE Final   Influenza B by PCR NEGATIVE NEGATIVE Final    Comment: (NOTE) The Xpert Xpress SARS-CoV-2/FLU/RSV plus assay is intended as an aid in the diagnosis of influenza from Nasopharyngeal swab specimens and should not be used as a sole basis for treatment. Nasal washings and aspirates are unacceptable for Xpert Xpress SARS-CoV-2/FLU/RSV testing.  Fact Sheet for Patients: EntrepreneurPulse.com.au  Fact Sheet for Healthcare Providers: IncredibleEmployment.be  This test is not yet approved or cleared by the [place] FDA and has been authorized for detection and/or diagnosis of SARS-CoV-2 by FDA under an Emergency Use Authorization (EUA). This EUA will remain in effect (meaning this test can be used) for the duration of the COVID-19 declaration under Section 564(b)(1) of the Act, 21 U.S.C. section 360bbb-3(b)(1), unless the authorization is terminated or revoked.     Resp Syncytial Virus by PCR NEGATIVE NEGATIVE Final    Comment: (NOTE) Fact Sheet for Patients: EntrepreneurPulse.com.au  Fact Sheet for Healthcare Providers: IncredibleEmployment.be  This test is not yet approved or cleared by the [place] FDA and has been authorized for detection and/or diagnosis of SARS-CoV-2 by FDA under an Emergency Use Authorization (EUA). This EUA will remain in effect (meaning this test can be used) for the duration of the COVID-19 declaration under Section 564(b)(1) of the Act, 21 U.S.C. section 360bbb-3(b)(1), unless the authorization is terminated or revoked.  Performed at [facility], [address],  [place], [place] [address]   MRSA Next Gen by PCR, Nasal     Status: None   Collection Time: 05/27/22  5:00 PM   Specimen: Nasal Mucosa; Nasal Swab  Result Value Ref Range Status   MRSA by PCR Next Gen NOT DETECTED NOT DETECTED Final    Comment: (NOTE) The GeneXpert MRSA Assay (FDA approved for NASAL specimens only), is one component of a comprehensive MRSA colonization surveillance program. It is not intended to diagnose MRSA infection nor to guide or monitor treatment for MRSA infections. Test performance is not FDA approved in patients less than 55 years old. Performed at [facility], [address] [place], [place] [address]     Labs: CBC: Recent Labs  Lab 05/26/22 1514 05/27/22 0332 05/28/22 0342  WBC 10.1 9.4 8.2  HGB 16.0 14.8 15.1  HCT 48.8 44.0 45.0  MCV 98.8 97.8 97.6  PLT 345 311 [id number]   Basic Metabolic Panel: Recent Labs  Lab 05/26/22 1514 05/27/22 0332  NA 136 135  K 3.4* 4.1  CL 103 105  CO2 24 23  GLUCOSE 160* 119*  BUN 13 14  CREATININE 1.06 0.88  CALCIUM 8.7* 8.1*  MG  --  1.8   Liver Function Tests: Recent Labs  Lab 05/27/22 0332  AST 26  ALT 24  ALKPHOS 80  BILITOT 0.9  PROT 6.2*  ALBUMIN 3.2*   CBG: No results for input(s): "GLUCAP" in the last 168 hours.  Discharge time spent: greater than 30 minutes.  Signed: Barton Dubois, MD [facility] 05/28/2022

## 2022-05-28 NOTE — Assessment & Plan Note (Signed)
-  Patient reports good control of blood pressure while using Proscar only -Heart healthy/low-sodium diet discussed with patient -Will recommend reassessment of his blood pressure at follow-up visit with further adjustment to antihypertensive regimen as required.

## 2022-05-28 NOTE — Assessment & Plan Note (Signed)
-  Continue low-sodium diet, daily weights, and control of hypertension. -Condition overall compensated and no signs of fluid overload appreciated.

## 2022-05-28 NOTE — Progress Notes (Signed)
Patient alert and oriented x4. No complaints of pain, shortness of breath, chest pain, dizziness, nausea or vomiting. Patient up out of bed, ambulatory independently with steady gait. Patient tolerated PO meds and diet well, appetite good. Went over discharged summary, medication education and follow up appointment information with patient and patient daughter. All questions answered and writer went over precautions taking Apixaban (blood thinner) and things to look for and seek medical attention for. Both patient and patient daughter expressed full understanding with teach back. IV's removed and pressure applied with no complications. Both IV dressings were clean, dry and intact about 25 minutes after IV removed and at time of discharge. Patient discharged with all belongings for home via car (daughter driving patient home).

## 2022-05-28 NOTE — Assessment & Plan Note (Signed)
-  Right lower extremity acute DVT appreciated -Continue treatment with anticoagulation therapy and follow-up as an outpatient with vascular service.

## 2022-05-28 NOTE — Progress Notes (Signed)
Patient slept well throughout the night in RA with his oxygen saturation being 91-93%. Didn't require supplemental oxygen neither showed any signs of distress. Will continue to monitor.

## 2022-05-28 NOTE — Progress Notes (Signed)
ANTICOAGULATION CONSULT NOTE   Pharmacy Consult for Heparin >> Apixaban Indication: pulmonary embolus  Allergies  Allergen Reactions   Iodine    Tetracyclines & Related     Hospitalized after taking - unsure of reaction    Patient Measurements: Height: 5\' 10"  (177.8 cm) Weight: 87.7 kg (193 lb 5.5 oz) IBW/kg (Calculated) : 73 HEPARIN DW (KG): 87.7   Vital Signs: Temp: 97.6 F (36.4 C) (01/06 0800) Temp Source: Oral (01/06 0800) BP: 129/73 (01/06 0800) Pulse Rate: 63 (01/06 0800)  Labs: Recent Labs    05/26/22 1514 05/26/22 1517 05/26/22 1709 05/26/22 2352 05/27/22 0332 05/27/22 0822 05/28/22 0342  HGB 16.0  --   --   --  14.8  --  15.1  HCT 48.8  --   --   --  44.0  --  45.0  PLT 345  --   --   --  311  --  319  APTT  --  30  --   --   --   --   --   LABPROT  --  13.5  --   --   --   --   --   INR  --  1.0  --   --   --   --   --   HEPARINUNFRC  --   --   --  0.38  --  0.55 0.44  CREATININE 1.06  --   --   --  0.88  --   --   TROPONINIHS 3  --  5  --   --   --   --      Estimated Creatinine Clearance: 68.5 mL/min (by C-G formula based on SCr of 0.88 mg/dL).   Medical History: Past Medical History:  Diagnosis Date   Acute pulmonary embolism ([place])    Benign prostatic hypertrophy    Deviated septum    DVT (deep venous thrombosis) (HCC)    Pneumonia of lower lobe of lung    bilateral   Respiratory failure with hypoxia (HCC)     Assessment: Patient presented today for chest congestion and shortness of breath with walking. 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 Patient stopped Eliquis about 6 months ago by hematologist and was placed on aspirin.  Reportedly, had been on Apixaban for 2 years.  Of note, pt had difficulty affording Apixaban when MD stopped med and changed to aspirin.  Discussed plan with team and will continue heparin today and plan to transition to apixaban this weekend.  Heparin level at goal this morning on 1500  units/hr. CBC within normal limits. No bleeding or IV issues noted.  Plan to transition back to Apixaban for PE.  Goal of Therapy:  Therapeutic anticoagulation for PE Monitor platelets by anticoagulation protocol: Yes   Plan:  D/C Heparin infusion Apixaban 10mg  po BID x 7 days then 5mg  po BID Pt has been on apixaban in the past, f/u discharge education.  Hart Robinsons, PharmD Clinical Pharmacist 05/28/2022 11:17 AM

## 2022-05-28 NOTE — Progress Notes (Signed)
SATURATION QUALIFICATIONS: (This note is used to comply with regulatory documentation for home oxygen)  Patient Saturations on Room Air at Rest = 92%  Patient Saturations on Room Air while Ambulating = 91%    Please briefly explain why patient needs home oxygen: N/A  Patient ambulated around unit 3 times at average pace on Room air. No assistive device used and ambulated independently with steady gait. Patient denied feeling short of breath, chest pain, discomfort or nausea/vomiting. Dr Dyann Kief made aware

## 2022-05-30 ENCOUNTER — Encounter: Payer: Self-pay | Admitting: *Deleted

## 2022-05-30 ENCOUNTER — Telehealth: Payer: Self-pay | Admitting: *Deleted

## 2022-05-30 NOTE — Patient Outreach (Signed)
  Care Coordination [facility] Note Transition Care Management Unsuccessful Follow-up Telephone Call  Date of discharge and from where:  Saturday, 05/28/22 [facility]; acute pulmonary embolism/ SOB/ (R) leg swelling with DVT  Attempts:  1st Attempt  Reason for unsuccessful TCM follow-up call:  Left voice message  Oneta Rack, RN, BSN, CCRN Alumnus RN CM Care Coordination/ Transition of [place] Management [phone]: direct office

## 2022-05-31 ENCOUNTER — Telehealth: Payer: Self-pay | Admitting: *Deleted

## 2022-05-31 ENCOUNTER — Encounter: Payer: Self-pay | Admitting: *Deleted

## 2022-05-31 NOTE — Patient Outreach (Signed)
Care Coordination [facility] Note Transition Care Management Follow-up Telephone Call Date of discharge and from where: Saturday 05/28/22 [facility]; acute pulmonary embolism- shortness of breath/ (R) leg swelling with DVT How have you been since you were released from the hospital? "I am doing much better, not having any problems.  I did start taking the Eliquis, I wish they could have put me on another drug that is not as expensive.  I don't really want to talk to a pharmacist about patient assistance, I don't think I would qualify anyway-- I will just talk to Dr. Sharlet Salina about it when I see her next week.  I manage all of my medications on my own, and I am able to do everything for myself to take care of myself, but my girlfriend helps me if I need anything" Any questions or concerns? No  Items Reviewed: Did the pt receive and understand the discharge instructions provided? Yes  Medications obtained and verified? Yes  full medication review completed/ no concerns or discrepancies identified; patient self-manages medications and denies questions- reports concern regarding cost of Eliquis, but declined my offer to place pharmacy referral to explore patient assistance options, stating he does not think he would qualify; patient verbalizes excellent understanding of the purpose, dosing and scheduling of his medications  Other? No  Any new allergies since your discharge? No  Dietary orders reviewed? Yes Do you have support at home? Yes  reports his girlfriend assists with care needs as indicated; reports he is essentially independent in all self-care activities  [place] and Equipment/Supplies: Were home health services ordered? no If so, what is the name of the agency? N/A  Has the agency set up a time to come to the patient's home? not applicable Were any new equipment or medical supplies ordered?  No What is the name of the medical supply agency? N/A Were you able to get the  supplies/equipment? not applicable Do you have any questions related to the use of the equipment or supplies? No N/A  Functional Questionnaire: (I = Independent and D = Dependent) ADLs: I  Bathing/Dressing- I  Meal Prep- I  Eating- I  Maintaining continence- I  Transferring/Ambulation- I  Managing Meds- I  Follow up appointments reviewed:  PCP Hospital f/u appt confirmed? Yes  Scheduled to see PCP on Tuesday, 06/07/22 @ 11:00 am [place] [facility] f/u appt confirmed? No  Scheduled to see - on - @ - Are transportation arrangements needed? No  If their condition worsens, is the pt aware to call PCP or go to the Emergency Dept.? Yes Was the patient provided with contact information for the PCP's office or ED? No- patient declined; reports already has contact information for all care providers Was to pt encouraged to call back with questions or concerns? Yes  SDOH assessments and interventions completed:   Yes SDOH Interventions Today    Flowsheet Row Most Recent Value  SDOH Interventions   Food Insecurity Interventions Intervention Not Indicated  Transportation Interventions Intervention Not Indicated  [patient drives self,  his significant other/ girlfriend assists as indicated]      Care Coordination Interventions:  Full medication review completed; provided education around possibility of pharmacy referral for patient assistance for Eliquis; provided education around action plan for falls in setting of new ACT; provided education around signs/ symptoms to watch for when on ACT    Encounter Outcome:  Pt. Visit Completed    Oneta Rack, RN, BSN, CCRN Alumnus RN CM Care Coordination/  Transition of Care- [place] Management [phone]: direct office

## 2022-06-07 ENCOUNTER — Encounter: Payer: Self-pay | Admitting: Internal Medicine

## 2022-06-07 ENCOUNTER — Ambulatory Visit (INDEPENDENT_AMBULATORY_CARE_PROVIDER_SITE_OTHER): Payer: Medicare Other | Admitting: Internal Medicine

## 2022-06-07 VITALS — BP 130/80 | HR 81 | Temp 98.2°F | Ht 70.0 in | Wt 196.0 lb

## 2022-06-07 DIAGNOSIS — I2782 Chronic pulmonary embolism: Secondary | ICD-10-CM | POA: Diagnosis not present

## 2022-06-07 MED ORDER — APIXABAN 5 MG PO TABS: 5.0000 mg | ORAL_TABLET | Freq: Two times a day (BID) | ORAL | 3 refills | Status: DC

## 2022-06-07 NOTE — Progress Notes (Signed)
   Subjective:   Patient ID: Richard Cannon, male    DOB: 1934-08-10, 88 y.o.   MRN: [id number]  HPI The patient is an 88 YO man coming in for hospital follow up. Following cessation of NOAC he did have recurrent DVT/bilateral PE. Started on anticoagulation in the hospital and doing well. Pain with breathing is resolved and leg swelling is gone as well.   Review of Systems  Constitutional: Negative.   HENT: Negative.    Eyes: Negative.   Respiratory:  Negative for cough, chest tightness and shortness of breath.   Cardiovascular:  Negative for chest pain, palpitations and leg swelling.  Gastrointestinal:  Negative for abdominal distention, abdominal pain, constipation, diarrhea, nausea and vomiting.  Musculoskeletal: Negative.   Skin: Negative.   Neurological: Negative.   Psychiatric/Behavioral: Negative.      Objective:  Physical Exam Constitutional:      Appearance: He is well-developed.  HENT:     Head: Normocephalic and atraumatic.  Cardiovascular:     Rate and Rhythm: Normal rate and regular rhythm.  Pulmonary:     Effort: Pulmonary effort is normal. No respiratory distress.     Breath sounds: Normal breath sounds. No wheezing or rales.  Abdominal:     General: Bowel sounds are normal. There is no distension.     Palpations: Abdomen is soft.     Tenderness: There is no abdominal tenderness. There is no rebound.  Musculoskeletal:     Cervical back: Normal range of motion.  Skin:    General: Skin is warm and dry.  Neurological:     Mental Status: He is alert and oriented to person, place, and time.     Coordination: Coordination normal.     Vitals:   06/07/22 1051  BP: 130/80  Pulse: 81  Temp: 98.2 F (36.8 C)  TempSrc: Oral  SpO2: 97%  Weight: 196 lb (88.9 kg)  Height: 5\' 10"  (1.778 m)    Assessment & Plan:  Visit time 25 minutes in face to face communication with patient and coordination of care, additional 5 minutes spent in record review, coordination  or care, ordering tests, communicating/referring to other healthcare professionals, documenting in medical records all on the same day of the visit for total time 30 minutes spent on the visit.

## 2022-06-07 NOTE — Assessment & Plan Note (Addendum)
With now 3 occurrences without clear trigger needs lifelong anticoagulation. Is currently taking eliquis and refilled today for chronic dosing. He has had financial difficulty with this medication in the past. Counseled about warfarin but would not want this in the short term until highest risk of recurrent clot is passed. We will reassess in 6 months.Medication reconciliation done during visit with hospital discharge list.

## 2022-06-30 DIAGNOSIS — H26493 Other secondary cataract, bilateral: Secondary | ICD-10-CM | POA: Diagnosis not present

## 2022-06-30 DIAGNOSIS — H401131 Primary open-angle glaucoma, bilateral, mild stage: Secondary | ICD-10-CM | POA: Diagnosis not present

## 2022-07-11 ENCOUNTER — Encounter: Payer: Self-pay | Admitting: Internal Medicine

## 2022-07-26 DIAGNOSIS — X32XXXD Exposure to sunlight, subsequent encounter: Secondary | ICD-10-CM | POA: Diagnosis not present

## 2022-07-26 DIAGNOSIS — D225 Melanocytic nevi of trunk: Secondary | ICD-10-CM | POA: Diagnosis not present

## 2022-07-26 DIAGNOSIS — L570 Actinic keratosis: Secondary | ICD-10-CM | POA: Diagnosis not present

## 2022-08-30 ENCOUNTER — Encounter: Payer: Medicare Other | Admitting: Internal Medicine

## 2022-09-05 ENCOUNTER — Ambulatory Visit (INDEPENDENT_AMBULATORY_CARE_PROVIDER_SITE_OTHER): Payer: Medicare Other | Admitting: Internal Medicine

## 2022-09-05 ENCOUNTER — Encounter: Payer: Self-pay | Admitting: Internal Medicine

## 2022-09-05 ENCOUNTER — Ambulatory Visit (INDEPENDENT_AMBULATORY_CARE_PROVIDER_SITE_OTHER): Payer: Medicare Other

## 2022-09-05 VITALS — BP 138/80 | HR 75 | Temp 98.6°F | Ht 70.0 in | Wt 202.0 lb

## 2022-09-05 DIAGNOSIS — Z86711 Personal history of pulmonary embolism: Secondary | ICD-10-CM | POA: Diagnosis not present

## 2022-09-05 DIAGNOSIS — R350 Frequency of micturition: Secondary | ICD-10-CM

## 2022-09-05 DIAGNOSIS — I10 Essential (primary) hypertension: Secondary | ICD-10-CM

## 2022-09-05 DIAGNOSIS — Z0000 Encounter for general adult medical examination without abnormal findings: Secondary | ICD-10-CM

## 2022-09-05 DIAGNOSIS — N401 Enlarged prostate with lower urinary tract symptoms: Secondary | ICD-10-CM

## 2022-09-05 DIAGNOSIS — R059 Cough, unspecified: Secondary | ICD-10-CM | POA: Diagnosis not present

## 2022-09-05 DIAGNOSIS — I5032 Chronic diastolic (congestive) heart failure: Secondary | ICD-10-CM | POA: Diagnosis not present

## 2022-09-05 LAB — COMPREHENSIVE METABOLIC PANEL
ALT: 23 U/L (ref 0–53)
AST: 25 U/L (ref 0–37)
Albumin: 3.9 g/dL (ref 3.5–5.2)
Alkaline Phosphatase: 69 U/L (ref 39–117)
BUN: 16 mg/dL (ref 6–23)
CO2: 22 mEq/L (ref 19–32)
Calcium: 8.9 mg/dL (ref 8.4–10.5)
Chloride: 106 mEq/L (ref 96–112)
Creatinine, Ser: 0.97 mg/dL (ref 0.40–1.50)
GFR: 71.17 mL/min (ref >60.00)
Glucose, Bld: 98 mg/dL (ref 70–99)
Potassium: 4.1 mEq/L (ref 3.5–5.1)
Sodium: 137 mEq/L (ref 135–145)
Total Bilirubin: 0.4 mg/dL (ref 0.2–1.2)
Total Protein: 6.6 g/dL (ref 6.0–8.3)

## 2022-09-05 LAB — LIPID PANEL
Cholesterol: 186 mg/dL (ref 0–200)
HDL: 38.3 mg/dL — ABNORMAL LOW (ref >39.00)
NonHDL: 148.15
Total CHOL/HDL Ratio: 5
Triglycerides: 377 mg/dL — ABNORMAL HIGH (ref 0.0–149.0)
VLDL: 75.4 mg/dL — ABNORMAL HIGH (ref 0.0–40.0)

## 2022-09-05 LAB — CBC
HCT: 47.1 % (ref 39.0–52.0)
Hemoglobin: 16 g/dL (ref 13.0–17.0)
MCHC: 34 g/dL (ref 30.0–36.0)
MCV: 96.6 fl (ref 78.0–100.0)
Platelets: 286 10*3/uL (ref 150.0–400.0)
RBC: 4.87 Mil/uL (ref 4.22–5.81)
RDW: 14.5 % (ref 11.5–15.5)
WBC: 6.3 10*3/uL (ref 4.0–10.5)

## 2022-09-05 NOTE — Patient Instructions (Signed)
We will check the labs today. 

## 2022-09-05 NOTE — Progress Notes (Signed)
Subjective:   Patient ID: Richard Cannon, male    DOB: 05/08/1935, 88 y.o.   MRN: [id number]  HPI Here for medicare wellness and physical, no new complaints. Please see A/P for status and treatment of chronic medical problems.   Diet: heart healthy  Physical activity: walk daily Depression/mood screen: negative Hearing: intact to whispered voice, moderate loss bilaterally Visual acuity: grossly normal, performs annual eye exam  ADLs: capable Fall risk: none Home safety: good Cognitive evaluation: intact to orientation, naming, recall and repetition EOL planning: adv directives discussed  Flowsheet Row Office Visit from 06/07/2022 in [facility] [place]  PHQ-2 Total Score 0       Flowsheet Row Office Visit from 06/07/2022 in [facility]  PHQ-9 Total Score 0         05/27/2022    8:00 AM 05/27/2022    8:00 PM 05/28/2022    8:00 AM 06/07/2022   10:57 AM 09/05/2022    2:40 PM  Fall Risk  Falls in the past year?    0 0  Was there an injury with Fall?    0 0  Fall Risk Category Calculator    0 0  (RETIRED) Patient Fall Risk Level Moderate fall risk High fall risk High fall risk    Fall risk Follow up    Falls evaluation completed Falls evaluation completed    I have personally reviewed and have noted 1. The patient's medical and social history - reviewed today no changes 2. Their use of alcohol, tobacco or illicit drugs 3. Their current medications and supplements 4. The patient's functional ability including ADL's, fall risks, home safety risks and hearing or visual impairment. 5. Diet and physical activities 6. Evidence for depression or mood disorders 7. Care team reviewed and updated 8.  The patient is not on an opioid pain medication.  Patient Care Team: Myrlene Broker, MD as PCP - General (Internal Medicine) Past Medical History:  Diagnosis Date   Acute pulmonary embolism    Benign prostatic  hypertrophy    Deviated septum    DVT (deep venous thrombosis)    Pneumonia of lower lobe of lung    bilateral   Respiratory failure with hypoxia    Past Surgical History:  Procedure Laterality Date   EYE SURGERY     hernia surgery  05/30/2014   SEPTOPLASTY N/A 10/05/2015   Procedure: SEPTOPLASTY;  Surgeon: Newman Pies, MD;  Location: [facility];  Service: ENT;  Laterality: N/A;   TURBINATE REDUCTION Bilateral 10/05/2015   Procedure: BILATERAL TURBINATE REDUCTION;  Surgeon: Newman Pies, MD;  Location: [facility];  Service: ENT;  Laterality: Bilateral;   Family History  Problem Relation Age of Onset   Heart disease Mother    Breast cancer Mother    Skin cancer Brother    Review of Systems  Constitutional:  Positive for fatigue.  HENT: Negative.    Eyes: Negative.   Respiratory:  Positive for cough. Negative for chest tightness and shortness of breath.   Cardiovascular:  Negative for chest pain, palpitations and leg swelling.  Gastrointestinal:  Negative for abdominal distention, abdominal pain, constipation, diarrhea, nausea and vomiting.  Musculoskeletal: Negative.   Skin: Negative.   Neurological: Negative.   Psychiatric/Behavioral: Negative.      Objective:  Physical Exam Constitutional:      Appearance: He is well-developed.  HENT:     Head: Normocephalic and atraumatic.  Cardiovascular:     Rate and Rhythm: Normal rate and regular rhythm.  Pulmonary:     Effort: Pulmonary effort is normal. No respiratory distress.     Breath sounds: Normal breath sounds. No wheezing or rales.  Abdominal:     General: Bowel sounds are normal. There is no distension.     Palpations: Abdomen is soft.     Tenderness: There is no abdominal tenderness. There is no rebound.  Musculoskeletal:     Cervical back: Normal range of motion.  Skin:    General: Skin is warm and dry.  Neurological:     Mental Status: He is alert and oriented to person, place, and time.      Coordination: Coordination normal.     Vitals:   09/05/22 1438  BP: 138/80  Pulse: 75  Temp: 98.6 F (37 C)  TempSrc: Oral  SpO2: 96%  Weight: 202 lb (91.6 kg)  Height: 5\' 10"  (1.778 m)    Assessment & Plan:

## 2022-09-06 DIAGNOSIS — Z86711 Personal history of pulmonary embolism: Secondary | ICD-10-CM | POA: Insufficient documentation

## 2022-09-06 LAB — LDL CHOLESTEROL, DIRECT: Direct LDL: 116 mg/dL

## 2022-09-06 LAB — D-DIMER, QUANTITATIVE: D-Dimer, Quant: 0.51 mcg/mL FEU — ABNORMAL HIGH (ref <0.50)

## 2022-09-06 NOTE — Assessment & Plan Note (Signed)
Has been on eliquis about 3 months now and checking d-dimer to see it is may be safe to offer change to warfarin. He does have cost issues with eliquis and unable to qualify for patient assistance when he checked previously. Continue eliquis for now. Due to 3+ DVT/PE episodes he does qualify for lifelong anticoagulation. No signs clinically of GI or other bleeding.

## 2022-09-06 NOTE — Assessment & Plan Note (Signed)
Taking proscar 5 mg daily and symptoms adequately controlled. Will continue at same dosing.

## 2022-09-06 NOTE — Assessment & Plan Note (Signed)
Flu shot yearly. Pneumonia complete. Shingrix due 2nd declines. Tetanus due 2030. Colonoscopy aged out. Counseled about sun safety and mole surveillance. Counseled about the dangers of distracted driving. Given 10 year screening recommendations.

## 2022-09-06 NOTE — Assessment & Plan Note (Signed)
BP at goal off medication for now. Will continue to monitor closely. Checking CMP for screening.

## 2022-09-06 NOTE — Assessment & Plan Note (Signed)
Checking CBC, CMP and CXR (new cough for 1 month). Adjust as needed. He is not taking any medications for this currently. Flare with PE and suspect may have been related to strain from PE.

## 2022-09-15 ENCOUNTER — Telehealth: Payer: Self-pay | Admitting: Internal Medicine

## 2022-09-15 NOTE — Telephone Encounter (Signed)
Called patient to schedule Medicare Annual Wellness Visit (AWV). Left message for patient to call back and schedule Medicare Annual Wellness Visit (AWV).  Last date of AWV: 09/04/2021  Please schedule an appointment at any time with [facility].  If any questions, please contact me at [phone].  Thank you ,  Bernice Cicero Care Guide [facility] AWV TEAM Direct Dial: [phone]    

## 2022-09-19 ENCOUNTER — Telehealth: Payer: Self-pay | Admitting: Internal Medicine

## 2022-09-19 NOTE — Telephone Encounter (Signed)
Contacted Lollie Sails to schedule their annual wellness visit. Appointment made for 09/28/2022.   [facility] Care Guide [facility] AWV TEAM Direct Dial: [phone]

## 2022-09-20 DIAGNOSIS — K080 Exfoliation of teeth due to systemic causes: Secondary | ICD-10-CM | POA: Diagnosis not present

## 2022-09-28 NOTE — Progress Notes (Signed)
This encounter was created in error - please disregard.  AWV-S was completed by PCP on 09/05/2022.  Iyahna Obriant N. Jhanvi Drakeford, LPN. [facility] AWV Team Direct Dial: [phone]

## 2022-10-24 DIAGNOSIS — H409 Unspecified glaucoma: Secondary | ICD-10-CM | POA: Diagnosis not present

## 2022-10-31 DIAGNOSIS — N401 Enlarged prostate with lower urinary tract symptoms: Secondary | ICD-10-CM | POA: Diagnosis not present

## 2022-10-31 DIAGNOSIS — R351 Nocturia: Secondary | ICD-10-CM | POA: Diagnosis not present

## 2022-11-25 ENCOUNTER — Telehealth: Payer: Self-pay | Admitting: Internal Medicine

## 2022-11-25 NOTE — Telephone Encounter (Signed)
Patient has two pills left.   Prescription Request  11/25/2022  LOV: 09/05/2022  What is the name of the medication or equipment? apixaban (ELIQUIS) 5 MG TABS tablet   Have you contacted your pharmacy to request a refill? No   Which pharmacy would you like this sent to?  [facility] on [address] in [place], [place]    Patient notified that their request is being sent to the clinical staff for review and that they should receive a response within 2 business days.   Please advise at [facility] [phone]

## 2022-11-28 ENCOUNTER — Other Ambulatory Visit: Payer: Self-pay

## 2022-11-28 MED ORDER — APIXABAN 5 MG PO TABS: 5.0000 mg | ORAL_TABLET | Freq: Two times a day (BID) | ORAL | 3 refills | Status: DC

## 2022-11-28 NOTE — Telephone Encounter (Signed)
Done

## 2022-11-28 NOTE — Telephone Encounter (Signed)
Pt is completely out of  apixaban (ELIQUIS) 5 MG TABS tablet.

## 2022-11-28 NOTE — Telephone Encounter (Signed)
Patient is completely out of Eliquis. They would like it to get filled ASAP at the [facility] on [address] in [place], [place].

## 2022-12-19 DIAGNOSIS — H903 Sensorineural hearing loss, bilateral: Secondary | ICD-10-CM | POA: Diagnosis not present

## 2022-12-28 DIAGNOSIS — H26493 Other secondary cataract, bilateral: Secondary | ICD-10-CM | POA: Diagnosis not present

## 2022-12-28 DIAGNOSIS — Z961 Presence of intraocular lens: Secondary | ICD-10-CM | POA: Diagnosis not present

## 2022-12-28 DIAGNOSIS — H401131 Primary open-angle glaucoma, bilateral, mild stage: Secondary | ICD-10-CM | POA: Diagnosis not present

## 2023-01-05 DIAGNOSIS — H26491 Other secondary cataract, right eye: Secondary | ICD-10-CM | POA: Diagnosis not present

## 2023-01-05 DIAGNOSIS — H26493 Other secondary cataract, bilateral: Secondary | ICD-10-CM | POA: Diagnosis not present

## 2023-01-24 DIAGNOSIS — L570 Actinic keratosis: Secondary | ICD-10-CM | POA: Diagnosis not present

## 2023-01-24 DIAGNOSIS — L820 Inflamed seborrheic keratosis: Secondary | ICD-10-CM | POA: Diagnosis not present

## 2023-01-24 DIAGNOSIS — D225 Melanocytic nevi of trunk: Secondary | ICD-10-CM | POA: Diagnosis not present

## 2023-01-24 DIAGNOSIS — X32XXXD Exposure to sunlight, subsequent encounter: Secondary | ICD-10-CM | POA: Diagnosis not present

## 2023-03-22 ENCOUNTER — Ambulatory Visit (INDEPENDENT_AMBULATORY_CARE_PROVIDER_SITE_OTHER): Payer: Medicare Other | Admitting: Internal Medicine

## 2023-03-22 ENCOUNTER — Encounter: Payer: Self-pay | Admitting: Internal Medicine

## 2023-03-22 VITALS — BP 130/80 | HR 68 | Temp 98.4°F | Ht 70.0 in | Wt 204.0 lb

## 2023-03-22 DIAGNOSIS — B029 Zoster without complications: Secondary | ICD-10-CM

## 2023-03-22 DIAGNOSIS — K047 Periapical abscess without sinus: Secondary | ICD-10-CM

## 2023-03-22 MED ORDER — TRIAMCINOLONE ACETONIDE 0.1 % EX CREA: 1.0000 | TOPICAL_CREAM | Freq: Two times a day (BID) | CUTANEOUS | 0 refills | Status: DC

## 2023-03-22 MED ORDER — AMOXICILLIN-POT CLAVULANATE 875-125 MG PO TABS: 1.0000 | ORAL_TABLET | Freq: Two times a day (BID) | ORAL | 0 refills | Status: AC

## 2023-03-22 NOTE — Progress Notes (Signed)
Subjective:    Patient ID: Richard Cannon, male    DOB: [birth year], 88 y.o.   MRN: [id number]      HPI Takeo is here for  Chief Complaint  Patient presents with   Rash    Rash that started on left arm and moved up to the left side of his neck; Noticed rash a week ago  He is here today with his wife.   He has a rash that started approximately 7-10 days ago as a cluster of on left upper arm.  Then he developed rash on the left side of his neck.  This morning his wife states that the rash was swollen and look like little blisters.  He states that the rash itches a lot and because it so itchy it is a little bit painful.  He has tried applying some antibacterial ointment and that has helped a little.  He denies any fevers, chills or feeling more tired than usual.  He is often outside in the yard, but does not recall encountering anything that would cause a rash.   He also has been experiencing some right upper tooth pain and thinks he may have an infection.  He wanted to deal with the rash before seeing the dentist.  He has not called yet for an appointment.  It does hurt when he eats on that side.  He has been eating on the left side.    Medications and allergies reviewed with patient and updated if appropriate.  Current Outpatient Medications on File Prior to Visit  Medication Sig Dispense Refill   acetaminophen (TYLENOL) 500 MG tablet Take 1 tablet (500 mg total) by mouth every 6 (six) hours as needed for mild pain or headache (or Fever >/= 101).     apixaban (ELIQUIS) 5 MG TABS tablet Take 1 tablet (5 mg total) by mouth 2 (two) times daily. 180 tablet 3   finasteride (PROSCAR) 5 MG tablet Take 1 tablet by mouth daily.  2   latanoprost (XALATAN) 0.005 % ophthalmic solution   98   Multiple Vitamin (MULTI-VITAMIN PO) Take 1 tablet by mouth daily.     sildenafil (VIAGRA) 100 MG tablet Take 0.5-1 tablets (50-100 mg total) by mouth daily as needed for erectile dysfunction. (Patient  taking differently: Take 20 mg by mouth daily as needed for erectile dysfunction.) 5 tablet 11   No current facility-administered medications on file prior to visit.    Review of Systems     Objective:   Vitals:   03/22/23 1543  BP: 130/80  Pulse: 68  Temp: 98.4 F (36.9 C)  SpO2: 98%   BP Readings from Last 3 Encounters:  03/22/23 130/80  09/05/22 138/80  06/07/22 130/80   Wt Readings from Last 3 Encounters:  03/22/23 204 lb (92.5 kg)  09/05/22 202 lb (91.6 kg)  06/07/22 196 lb (88.9 kg)   Body mass index is 29.27 kg/m.    Physical Exam Constitutional:      General: He is not in acute distress.    Appearance: Normal appearance. He is not ill-appearing.  HENT:     Head: Normocephalic and atraumatic.  Skin:    General: Skin is warm and dry.     Findings: Rash (Dried, residual rash on his left upper arm that is slightly erythematous, patchy.  Erythematous, papular rash on the left side of his neck-no obvious blisters, but they both said that the rash initially was more blisterlike) present.  Neurological:  Mental Status: He is alert.            Assessment & Plan:    Shingles: Acute Symptoms started 7-10 days ago At this point it is difficult to discern if this was definitely shingles by the rash, but by history and distribution it does look like shingles Not eligible for antiviral Rash is very itchy-can take an over-the-counter antihistamine daily Start triamcinolone cream 0.1% twice daily Deferred steroid injection Take Tylenol for pain Discussed trying lidocaine patches, ice packs or cool compresses If pain is not controlled advised him to contact us  Dental infection Acute Having right upper tooth pain-worse when eating on that side Has not called his dentist-advised calling to set up an appointment Start Augmentin 875-125 mg twice daily x 10 days

## 2023-03-22 NOTE — Patient Instructions (Addendum)
      Medications changes include :   triamcinolone cream twice a day.     Augmentin for possible dental infection.       Return if symptoms worsen or fail to improve.

## 2023-04-10 DIAGNOSIS — K080 Exfoliation of teeth due to systemic causes: Secondary | ICD-10-CM | POA: Diagnosis not present

## 2023-06-06 DIAGNOSIS — X32XXXD Exposure to sunlight, subsequent encounter: Secondary | ICD-10-CM | POA: Diagnosis not present

## 2023-06-06 DIAGNOSIS — L570 Actinic keratosis: Secondary | ICD-10-CM | POA: Diagnosis not present

## 2023-06-06 DIAGNOSIS — L820 Inflamed seborrheic keratosis: Secondary | ICD-10-CM | POA: Diagnosis not present

## 2023-09-11 ENCOUNTER — Ambulatory Visit (INDEPENDENT_AMBULATORY_CARE_PROVIDER_SITE_OTHER)

## 2023-09-11 VITALS — Ht 70.0 in | Wt 206.0 lb

## 2023-09-11 DIAGNOSIS — Z0000 Encounter for general adult medical examination without abnormal findings: Secondary | ICD-10-CM

## 2023-09-11 NOTE — Patient Instructions (Signed)
 Mr. Levee , Thank you for taking time to come for your Medicare Wellness Visit. I appreciate your ongoing commitment to your health goals. Please review the following plan we discussed and let me know if I can assist you in the future.   Referrals/Orders/Follow-Ups/Clinician Recommendations: It was nice talking to you today.    This is a list of the screening recommended for you and due dates:  Health Maintenance  Topic Date Due   Zoster (Shingles) Vaccine (2 of 2) 08/02/2018   COVID-19 Vaccine (3 - 2024-25 season) 01/22/2023   Medicare Annual Wellness Visit  09/05/2023   Flu Shot  12/22/2023   DTaP/Tdap/Td vaccine (3 - Td or Tdap) 06/12/2028   Pneumonia Vaccine  Completed   HPV Vaccine  Aged Out   Meningitis B Vaccine  Aged Out    Advanced directives: (Copy Requested) Please bring a copy of your health care power of attorney and living will to the office to be added to your chart at your convenience. You can mail to [facility] [address] [place], [place] [address] or email to [email or web address]  Next Medicare Annual Wellness Visit scheduled for next year: Yes

## 2023-09-11 NOTE — Progress Notes (Signed)
 Subjective:   Richard Cannon is a 89 y.o. who presents for a Medicare Wellness preventive visit.  Visit Complete: Virtual I connected with  Richard Cannon on 09/11/23 by a audio enabled telemedicine application and verified that I am speaking with the correct person using two identifiers.  Patient Location: Home  Provider Location: Home Office  I discussed the limitations of evaluation and management by telemedicine. The patient expressed understanding and agreed to proceed.  Vital Signs: Because this visit was a virtual/telehealth visit, some criteria may be missing or patient reported. Any vitals not documented were not able to be obtained and vitals that have been documented are patient reported.  VideoDeclined- This patient declined Librarian, academic. Therefore the visit was completed with audio only.  Persons Participating in Visit: Patient.  AWV Questionnaire: No: Patient Medicare AWV questionnaire was not completed prior to this visit.  Cardiac Risk Factors include: advanced age (>78men, >42 women);Other (see comment);hypertension;male gender, Risk factor comments: CHF     Objective:    Today's Vitals   09/11/23 1507  Weight: 206 lb (93.4 kg)  Height: 5\' 10"  (1.778 m)   Body mass index is 29.56 kg/m.     09/11/2023    3:18 PM 05/26/2022   11:04 PM 05/26/2022   11:03 PM 05/26/2022    2:56 PM 05/10/2018    1:15 PM 05/08/2017    1:24 PM 10/05/2015    9:03 AM  Advanced Directives  Does Patient Have a Medical Advance Directive? Yes  Yes Yes Yes Yes Yes  Type of Estate agent of [place];Living will Living will Living will Living will Healthcare Power of [place];Living will Healthcare Power of [place];Living will Healthcare Power of [place];Living will  Does patient want to make changes to medical advance directive?       No - Patient declined  Copy of Healthcare Power of Attorney in Chart? No - copy requested    No -  copy requested No - copy requested No - copy requested    Current Medications (verified) Outpatient Encounter Medications as of 09/11/2023  Medication Sig   acetaminophen  (TYLENOL ) 500 MG tablet Take 1 tablet (500 mg total) by mouth every 6 (six) hours as needed for mild pain or headache (or Fever >/= 101).   apixaban  (ELIQUIS ) 5 MG TABS tablet Take 1 tablet (5 mg total) by mouth 2 (two) times daily.   finasteride  (PROSCAR ) 5 MG tablet Take 1 tablet by mouth daily.   latanoprost (XALATAN) 0.005 % ophthalmic solution    Multiple Vitamin (MULTI-VITAMIN PO) Take 1 tablet by mouth daily.   sildenafil  (VIAGRA ) 100 MG tablet Take 0.5-1 tablets (50-100 mg total) by mouth daily as needed for erectile dysfunction. (Patient taking differently: Take 20 mg by mouth daily as needed for erectile dysfunction.)   triamcinolone  cream (KENALOG ) 0.1 % Apply 1 Application topically 2 (two) times daily.   No facility-administered encounter medications on file as of 09/11/2023.    Allergies (verified) Iodine and Tetracyclines & related   History: Past Medical History:  Diagnosis Date   Acute pulmonary embolism (HCC)    Benign prostatic hypertrophy    Deviated septum    DVT (deep venous thrombosis) (HCC)    Pneumonia of lower lobe of lung    bilateral   Respiratory failure with hypoxia [facility])    Past Surgical History:  Procedure Laterality Date   EYE SURGERY     hernia surgery  05/30/2014   SEPTOPLASTY N/A 10/05/2015  Procedure: SEPTOPLASTY;  Surgeon: Reynold Caves, MD;  Location: [facility];  Service: ENT;  Laterality: N/A;   TURBINATE REDUCTION Bilateral 10/05/2015   Procedure: BILATERAL TURBINATE REDUCTION;  Surgeon: Reynold Caves, MD;  Location: [facility];  Service: ENT;  Laterality: Bilateral;   Family History  Problem Relation Age of Onset   Heart disease Mother    Breast cancer Mother    Skin cancer Brother    Social History   Socioeconomic History   Marital status:  Widowed    Spouse name: Not on file   Number of children: 3   Years of education: Not on file   Highest education level: Not on file  Occupational History   Occupation: Retired    Comment: Printing/Farming  Tobacco Use   Smoking status: Never   Smokeless tobacco: Never  Vaping Use   Vaping status: Never Used  Substance and Sexual Activity   Alcohol use: No    Alcohol/week: 0.0 standard drinks of alcohol   Drug use: No   Sexual activity: Yes  Other Topics Concern   Not on file  Social History Narrative   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      Lives with a friend   Social Drivers of Corporate investment banker Strain: Low Risk  (09/11/2023)   Overall Financial Resource Strain (CARDIA)    Difficulty of Paying Living Expenses: Not hard at all  Food Insecurity: No Food Insecurity (09/11/2023)   Hunger Vital Sign    Worried About Running Out of Food in the Last Year: Never true    Ran Out of Food in the Last Year: Never true  Transportation Needs: No Transportation Needs (09/11/2023)   PRAPARE - Administrator, Civil Service (Medical): No    Lack of Transportation (Non-Medical): No  Physical Activity: Sufficiently Active (05/10/2018)   Exercise Vital Sign    Days of Exercise per Week: 7 days    Minutes of Exercise per Session: 80 min  Stress: No Stress Concern Present (09/11/2023)   [employer] of Occupational Health - Occupational Stress Questionnaire    Feeling of Stress : Not at all  Social Connections: Moderately Integrated (09/11/2023)   Social Connection and Isolation Panel [NHANES]    Frequency of Communication with Friends and Family: More than three times a week    Frequency of Social Gatherings with Friends and Family: Once a week    Attends Religious Services: More than 4 times per year    Active Member of [employer] or Organizations: Yes    Attends Banker Meetings: Never    Marital Status: Widowed    Tobacco  Counseling Counseling given: Not Answered    Clinical Intake:  Pre-visit preparation completed: Yes  Pain : No/denies pain     BMI - recorded: 29.56 Nutritional Status: BMI 25 -29 Overweight Nutritional Risks: None Diabetes: No  No results found for: "HGBA1C"   How often do you need to have someone help you when you read instructions, pamphlets, or other written materials from your doctor or pharmacy?: 1 - Never  Interpreter Needed?: No  Information entered by :: Jacorion Klem, RMA   Activities of Daily Living     09/11/2023    3:16 PM  In your present state of health, do you have any difficulty performing the following activities:  Hearing? 1  Comment Wears hearing aides  Vision? 0  Difficulty concentrating or making decisions? 0  Walking or climbing stairs? 0  Dressing or bathing? 0  Doing errands, shopping? 0  Preparing Food and eating ? N  Using the Toilet? N  In the past six months, have you accidently leaked urine? N  Do you have problems with loss of bowel control? N  Managing your Medications? N  Managing your Finances? N  Housekeeping or managing your Housekeeping? N    Patient Care Team: Adelia Homestead, MD as PCP - General (Internal Medicine) Alvina Axon, MD as Consulting Physician (Ophthalmology)  Indicate any recent Medical Services you may have received from other than [facility] providers in the past year (date may be approximate).     Assessment:   This is a routine wellness examination for [place].  Hearing/Vision screen Hearing Screening - Comments:: Wears hearing aides Vision Screening - Comments:: Cataract surgery/laser   Goals Addressed             This Visit's Progress    Patient Stated   On track    Maintain current health status. 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       Depression Screen     09/11/2023    3:21 PM 03/22/2023    3:50 PM  06/07/2022   10:57 AM 08/31/2021    3:49 PM 08/28/2020    2:17 PM 06/27/2019   12:56 PM 05/10/2018    1:18 PM  PHQ 2/9 Scores  PHQ - 2 Score 0 0 0 0 0 0 0  PHQ- 9 Score 0  0        Fall Risk     09/11/2023    3:19 PM 03/22/2023    3:50 PM 09/05/2022    2:40 PM 06/07/2022   10:57 AM 08/31/2021    3:49 PM  Fall Risk   Falls in the past year? 0 0 0 0 0  Number falls in past yr: 0 0 0 0 0  Injury with Fall? 0 0 0 0 0  Risk for fall due to : No Fall Risks No Fall Risks     Follow up Falls prevention discussed;Falls evaluation completed Falls evaluation completed Falls evaluation completed Falls evaluation completed     MEDICARE RISK AT HOME:  Medicare Risk at Home Any stairs in or around the home?: Yes If so, are there any without handrails?: Yes Home free of loose throw rugs in walkways, pet beds, electrical cords, etc?: Yes Adequate lighting in your home to reduce risk of falls?: Yes Life alert?: No Use of a cane, walker or w/c?: No Grab bars in the bathroom?: Yes Shower chair or bench in shower?: No Elevated toilet seat or a handicapped toilet?: Yes  TIMED UP AND GO:  Was the test performed?  No  Cognitive Function: Declined: Patient declined cognitive screening, but was able to answer questions in an accurate and timely manner. No cognitive impairments observed.    05/10/2018    1:53 PM 05/08/2017    1:55 PM  MMSE - Mini Mental State Exam  Orientation to time 5 5  Orientation to Place 5 5  Registration 3 3  Attention/ Calculation 5 4  Recall 1 2  Language- name 2 objects 2 2  Language- repeat 1 1  Language- follow 3 step command 3 3  Language- read & follow direction 1 1  Write a sentence 1 1  Copy design 1 1  Total score 28 28  Immunizations Immunization History  Administered Date(s) Administered   Influenza, High Dose Seasonal PF 03/11/2016, 03/23/2018   Influenza-Unspecified 02/20/2014, 03/21/2017, 02/21/2019, 03/07/2022   Moderna Sars-Covid-2  Vaccination 06/12/2019, 07/10/2019   Pneumococcal Conjugate-13 04/25/2016   Pneumococcal Polysaccharide-23 02/20/2014   Tdap 05/23/2008, 06/12/2018   Zoster Recombinant(Shingrix) 06/07/2018    Screening Tests Health Maintenance  Topic Date Due   Zoster Vaccines- Shingrix (2 of 2) 08/02/2018   COVID-19 Vaccine (3 - 2024-25 season) 01/22/2023   INFLUENZA VACCINE  12/22/2023   Medicare Annual Wellness (AWV)  09/10/2024   DTaP/Tdap/Td (3 - Td or Tdap) 06/12/2028   Pneumonia Vaccine 53+ Years old  Completed   HPV VACCINES  Aged Out   Meningococcal B Vaccine  Aged Out    Health Maintenance  Health Maintenance Due  Topic Date Due   Zoster Vaccines- Shingrix (2 of 2) 08/02/2018   COVID-19 Vaccine (3 - 2024-25 season) 01/22/2023   Health Maintenance Items Addressed: See Nurse Notes  Additional Screening:  Vision Screening: Recommended annual ophthalmology exams for early detection of glaucoma and other disorders of the eye.  Dental Screening: Recommended annual dental exams for proper oral hygiene  Community Resource Referral / Chronic Care Management: CRR required this visit?  No   CCM required this visit?  No     Plan:     I have personally reviewed and noted the following in the patient's chart:   Medical and social history Use of alcohol, tobacco or illicit drugs  Current medications and supplements including opioid prescriptions. Patient is not currently taking opioid prescriptions. Functional ability and status Nutritional status Physical activity Advanced directives List of other physicians Hospitalizations, surgeries, and ER visits in previous 12 months Vitals Screenings to include cognitive, depression, and falls Referrals and appointments  In addition, I have reviewed and discussed with patient certain preventive protocols, quality metrics, and best practice recommendations. A written personalized care plan for preventive services as well as general  preventive health recommendations were provided to patient.     Stevin Bielinski L Kyleah [place], CMA   09/11/2023   After Visit Summary: (MyChart) Due to this being a telephonic visit, the after visit summary with patients personalized plan was offered to patient via MyChart   Notes: Please refer to Routing Comments.

## 2023-09-18 ENCOUNTER — Ambulatory Visit: Payer: Self-pay

## 2023-09-18 ENCOUNTER — Ambulatory Visit (INDEPENDENT_AMBULATORY_CARE_PROVIDER_SITE_OTHER): Admitting: Family Medicine

## 2023-09-18 ENCOUNTER — Encounter: Payer: Self-pay | Admitting: Family Medicine

## 2023-09-18 VITALS — BP 132/78 | HR 89 | Temp 97.9°F | Ht 70.0 in | Wt 207.0 lb

## 2023-09-18 DIAGNOSIS — L255 Unspecified contact dermatitis due to plants, except food: Secondary | ICD-10-CM | POA: Diagnosis not present

## 2023-09-18 MED ORDER — METHYLPREDNISOLONE ACETATE 80 MG/ML IJ SUSP
80.0000 mg | Freq: Once | INTRAMUSCULAR | Status: AC
  Administered 2023-09-18: 80 mg via INTRAMUSCULAR

## 2023-09-18 MED ORDER — PREDNISONE 10 MG (21) PO TBPK: ORAL_TABLET | ORAL | 0 refills | Status: DC

## 2023-09-18 NOTE — Telephone Encounter (Signed)
 Chief Complaint: rash Symptoms: rash Frequency: x 1 week Pertinent Negatives: Patient denies no chest pain Disposition: [] ED /[] Urgent Care (no appt availability in office) / [x] Appointment(In office/virtual)/ []  [facility]/ [] Home Care/ [] Refused Recommended Disposition /[] [facility]/ []  Follow-up with PCP Additional Notes: patient gave consent to speak with Marily Shows. Marily Shows states that his right leg looks like he got into poison oak  about 7 days ago and they treated it over the counter but not better and it has spread to his back and front side.  States face was swollen on left side gave benadryl and now swelling gone down.   Copied from CRM [id number]. Topic: Clinical - Red Word Triage >> Sep 18, 2023  9:16 AM Jeris Montes S wrote: [facility] that prompted transfer to Nurse Triage: pt has swelling and a broken out in a rash on legs, back, front of chest as well looks like welps- believe was bitten by some type of insect with redness Reason for Disposition  Face becomes swollen  Answer Assessment - Initial Assessment Questions 1. APPEARANCE of RASH: "Describe the rash." (e.g., spots, blisters, raised areas, skin peeling, scaly)     Like poison on on thigh and leg, like heat rash on chest and bites on back 2. SIZE: "How big are the spots?" (e.g., tip of pen, eraser, coin; inches, centimeters)     All sizes 3. LOCATION: "Where is the rash located?"     Thigh, legs, chest and back 4. COLOR: "What color is the rash?" (Note: It is difficult to assess rash color in people with darker-colored skin. When this situation occurs, simply ask the caller to describe what they see.)     red 5. ONSET: "When did the rash begin?"     X week 6. FEVER: "Do you have a fever?" If Yes, ask: "What is your temperature, how was it measured, and when did it start?"     Low grade 7. ITCHING: "Does the rash itch?" If Yes, ask: "How bad is the itch?" (Scale 1-10; or mild, moderate, severe)     mod 8.  CAUSE: "What do you think is causing the rash?"     unknown 9. MEDICINE FACTORS: "Have you started any new medicines within the last 2 weeks?" (e.g., antibiotics)      no 10. OTHER SYMPTOMS: "Do you have any other symptoms?" (e.g., dizziness, headache, sore throat, joint pain)       no  Protocols used: Rash or Redness - [facility]

## 2023-09-18 NOTE — Progress Notes (Signed)
 Assessment & Plan:  1. Rhus dermatitis (Primary) Education provided on poison ivy dermatitis.  Recommended cool compresses and that heat is going to make the itching and rash worse.  Depo-Medrol given in office today.  Advised patient not to start Sterapred until tomorrow. - methylPREDNISolone acetate (DEPO-MEDROL) injection 80 mg - predniSONE  (STERAPRED UNI-PAK 21 TAB) 10 MG (21) TBPK tablet; Use as directed starting tomorrow.  Dispense: 21 each; Refill: 0   Follow up plan: Return if symptoms worsen or fail to improve.  Hershel Los, MSN, APRN, FNP-C  Subjective:  HPI: Richard Cannon is a 89 y.o. male presenting on 09/18/2023 for Rash (Rash on right leg started as poison ivy a week ago- has not gotten better after using Hydrocortisone  cream. New rash on back, chest , fingers and arms x 4days- very itchy. Benadryl gives relief. )  Patient is accompanied by his significant other, who he is okay with being present.  Patient reports a rash that started on his right leg that started after he was pulling poison ivy out of grape vines a week ago.  It spread to his back, chest, arms, and neck four days ago.  The rash is very itchy.  Benadryl does give him relief.   ROS: Negative unless specifically indicated above in HPI.   Relevant past medical history reviewed and updated as indicated.   Allergies and medications reviewed and updated.   Current Outpatient Medications:    acetaminophen  (TYLENOL ) 500 MG tablet, Take 1 tablet (500 mg total) by mouth every 6 (six) hours as needed for mild pain or headache (or Fever >/= 101)., Disp: , Rfl:    apixaban  (ELIQUIS ) 5 MG TABS tablet, Take 1 tablet (5 mg total) by mouth 2 (two) times daily., Disp: 180 tablet, Rfl: 3   finasteride  (PROSCAR ) 5 MG tablet, Take 1 tablet by mouth daily., Disp: , Rfl: 2   latanoprost (XALATAN) 0.005 % ophthalmic solution, , Disp: , Rfl: 98   Multiple Vitamin (MULTI-VITAMIN PO), Take 1 tablet by mouth daily., Disp: ,  Rfl:    sildenafil  (VIAGRA ) 100 MG tablet, Take 0.5-1 tablets (50-100 mg total) by mouth daily as needed for erectile dysfunction. (Patient taking differently: Take 20 mg by mouth daily as needed for erectile dysfunction.), Disp: 5 tablet, Rfl: 11  Allergies  Allergen Reactions   Iodine    Tetracyclines & Related     Hospitalized after taking - unsure of reaction    Objective:   BP 132/78   Pulse 89   Temp 97.9 F (36.6 C)   Ht 5\' 10"  (1.778 m)   Wt 207 lb (93.9 kg)   SpO2 97%   BMI 29.70 kg/m    Physical Exam Vitals reviewed.  Constitutional:      General: He is not in acute distress.    Appearance: Normal appearance. He is not ill-appearing, toxic-appearing or diaphoretic.  HENT:     Head: Normocephalic and atraumatic.  Eyes:     General: No scleral icterus.       Right eye: No discharge.        Left eye: No discharge.     Conjunctiva/sclera: Conjunctivae normal.  Cardiovascular:     Rate and Rhythm: Normal rate.  Pulmonary:     Effort: Pulmonary effort is normal. No respiratory distress.  Musculoskeletal:        General: Normal range of motion.     Cervical back: Normal range of motion.  Skin:    General: Skin is  warm and dry.     Findings: Rash present. Rash is crusting (RLE crusted over) and vesicular (back, neck, arms).  Neurological:     Mental Status: He is alert and oriented to person, place, and time. Mental status is at baseline.  Psychiatric:        Mood and Affect: Mood normal.        Behavior: Behavior normal.        Thought Content: Thought content normal.        Judgment: Judgment normal.

## 2023-10-09 ENCOUNTER — Ambulatory Visit: Admitting: Internal Medicine

## 2023-10-09 ENCOUNTER — Encounter: Payer: Self-pay | Admitting: Internal Medicine

## 2023-10-09 VITALS — BP 120/76 | HR 62 | Temp 98.7°F | Ht 70.0 in | Wt 200.0 lb

## 2023-10-09 DIAGNOSIS — L259 Unspecified contact dermatitis, unspecified cause: Secondary | ICD-10-CM | POA: Diagnosis not present

## 2023-10-09 DIAGNOSIS — I10 Essential (primary) hypertension: Secondary | ICD-10-CM

## 2023-10-09 DIAGNOSIS — L299 Pruritus, unspecified: Secondary | ICD-10-CM | POA: Diagnosis not present

## 2023-10-09 DIAGNOSIS — R21 Rash and other nonspecific skin eruption: Secondary | ICD-10-CM | POA: Diagnosis not present

## 2023-10-09 MED ORDER — HYDROXYZINE HCL 10 MG PO TABS: 10.0000 mg | ORAL_TABLET | Freq: Three times a day (TID) | ORAL | 1 refills | Status: DC | PRN

## 2023-10-09 MED ORDER — PREDNISONE 10 MG PO TABS: ORAL_TABLET | ORAL | 0 refills | Status: DC

## 2023-10-09 MED ORDER — DOXYCYCLINE HYCLATE 100 MG PO TABS: 100.0000 mg | ORAL_TABLET | Freq: Two times a day (BID) | ORAL | 0 refills | Status: DC

## 2023-10-09 NOTE — Assessment & Plan Note (Signed)
 Has numerous small excoriations after numerous mosquito bites; can't r/o impetigo or folliculitis - for doxycyline 100 bid course

## 2023-10-09 NOTE — Patient Instructions (Signed)
 Please take all new medication as prescribed - the antibiotic, and prednisone , and hydroxyzine  for itching as needed  Please continue all other medications as before, and refills have been done if requested.  Please have the pharmacy call with any other refills you may need.  Please keep your appointments with your specialists as you may have planned

## 2023-10-09 NOTE — Assessment & Plan Note (Signed)
 BP Readings from Last 3 Encounters:  10/09/23 120/76  09/18/23 132/78  03/22/23 130/80   Stable, pt to continue medical treatment  - diet, wt control

## 2023-10-09 NOTE — Progress Notes (Signed)
 Patient ID: [name], male   DOB: 1934/12/21, 89 y.o.   MRN: [id number]        Chief Complaint: follow up recent contact dermatitis, persistent itching and new multiple small shallow open sores to arms, upper back and chest after scratching       HPI:  Richard Cannon is a 89 y.o. male here after seen recently for contact dermatitis now markedly improved, but also at the time has numerous mosquito bites as well to the arms, upper back and chest, and distal legs and had persistent itchiing and scratching and now numerous small shallow excoriations.  Initially did well with prednisone , but now worsening again.  Pt denies chest pain, increased sob or doe, wheezing, orthopnea, PND, increased LE swelling, palpitations, dizziness or syncope.   Pt denies polydipsia, polyuria, or new focal neuro s/s.        Wt Readings from Last 3 Encounters:  10/09/23 200 lb (90.7 kg)  09/18/23 207 lb (93.9 kg)  09/11/23 206 lb (93.4 kg)   BP Readings from Last 3 Encounters:  10/09/23 120/76  09/18/23 132/78  03/22/23 130/80         Past Medical History:  Diagnosis Date   Acute pulmonary embolism (HCC)    Benign prostatic hypertrophy    Deviated septum    DVT (deep venous thrombosis) (HCC)    Pneumonia of lower lobe of lung    bilateral   Respiratory failure with hypoxia [facility])    Past Surgical History:  Procedure Laterality Date   EYE SURGERY     hernia surgery  05/30/2014   SEPTOPLASTY N/A 10/05/2015   Procedure: SEPTOPLASTY;  Surgeon: Richard Caves, MD;  Location: [facility];  Service: ENT;  Laterality: N/A;   TURBINATE REDUCTION Bilateral 10/05/2015   Procedure: BILATERAL TURBINATE REDUCTION;  Surgeon: Richard Caves, MD;  Location: [facility];  Service: ENT;  Laterality: Bilateral;    reports that he has never smoked. He has never used smokeless tobacco. He reports that he does not drink alcohol and does not use drugs. family history includes Breast cancer in his mother;  Heart disease in his mother; Skin cancer in his brother. Allergies  Allergen Reactions   Iodine    Tetracyclines & Related     Hospitalized after taking - unsure of reaction   Current Outpatient Medications on File Prior to Visit  Medication Sig Dispense Refill   acetaminophen  (TYLENOL ) 500 MG tablet Take 1 tablet (500 mg total) by mouth every 6 (six) hours as needed for mild pain or headache (or Fever >/= 101).     apixaban  (ELIQUIS ) 5 MG TABS tablet Take 1 tablet (5 mg total) by mouth 2 (two) times daily. 180 tablet 3   finasteride  (PROSCAR ) 5 MG tablet Take 1 tablet by mouth daily.  2   FLUZONE HIGH-DOSE 0.5 ML injection      latanoprost (XALATAN) 0.005 % ophthalmic solution   98   Multiple Vitamin (MULTI-VITAMIN PO) Take 1 tablet by mouth daily.     sildenafil  (VIAGRA ) 100 MG tablet Take 0.5-1 tablets (50-100 mg total) by mouth daily as needed for erectile dysfunction. (Patient taking differently: Take 20 mg by mouth daily as needed for erectile dysfunction.) 5 tablet 11   No current facility-administered medications on file prior to visit.        ROS:  All others reviewed and negative.  Objective        PE:  BP 120/76 (BP Location: Right  Arm, Patient Position: Sitting, Cuff Size: Normal)   Pulse 62   Temp 98.7 F (37.1 C) (Oral)   Ht 5\' 10"  (1.778 m)   Wt 200 lb (90.7 kg)   SpO2 98%   BMI 28.70 kg/m                 Constitutional: Pt appears in NAD               HENT: Head: NCAT.                Right Ear: External ear normal.                 Left Ear: External ear normal.                Eyes: . Pupils are equal, round, and reactive to light. Conjunctivae and EOM are normal               Nose: without d/c or deformity               Neck: Neck supple. Gross normal ROM               Cardiovascular: Normal rate and regular rhythm.                 Pulmonary/Chest: Effort normal and breath sounds without rales or wheezing.                Abd:  Soft, NT, ND, + BS, no  organomegaly               Neurological: Pt is alert. At baseline orientation, motor grossly intact               Skin: Skin is warm. LE edema - none numerous small excoriations some with erythema to upper back , chest, arms and distal legs               Psychiatric: Pt behavior is normal without agitation   Micro: none  Cardiac tracings I have personally interpreted today:  none  Pertinent Radiological findings (summarize): none   Lab Results  Component Value Date   WBC 6.3 09/05/2022   HGB 16.0 09/05/2022   HCT 47.1 09/05/2022   PLT 286.0 09/05/2022   GLUCOSE 98 09/05/2022   CHOL 186 09/05/2022   TRIG 377.0 (H) 09/05/2022   HDL 38.30 (L) 09/05/2022   LDLDIRECT 116.0 09/05/2022   LDLCALC 113 (H) 06/27/2019   ALT 23 09/05/2022   AST 25 09/05/2022   NA 137 09/05/2022   K 4.1 09/05/2022   CL 106 09/05/2022   CREATININE 0.97 09/05/2022   BUN 16 09/05/2022   CO2 22 09/05/2022   TSH 1.77 08/28/2020   INR 1.0 05/26/2022   Assessment/Plan:  Richard Cannon is a 89 y.o. White or Caucasian [1] male with  has a past medical history of Acute pulmonary embolism (HCC), Benign prostatic hypertrophy, Deviated septum, DVT (deep venous thrombosis) (HCC), Pneumonia of lower lobe of lung, and Respiratory failure with hypoxia (HCC).  Contact dermatitis To distal anterior right leg above the knee - now improved with minor erythema only  Pruritus Now for atarax  10 tid prn, repeat prednisone  asd  Rash Has numerous small excoriations after numerous mosquito bites; can't r/o impetigo or folliculitis - for doxycyline 100 bid course  Primary hypertension BP Readings from Last 3 Encounters:  10/09/23 120/76  09/18/23 132/78  03/22/23 130/80   Stable, pt to continue medical treatment  -  diet, wt control  Followup: Return if symptoms worsen or fail to improve.  Richard Colonel, MD 10/09/2023 7:27 PM [facility]

## 2023-10-09 NOTE — Assessment & Plan Note (Signed)
 To distal anterior right leg above the knee - now improved with minor erythema only

## 2023-10-09 NOTE — Assessment & Plan Note (Addendum)
 Now for atarax  10 tid prn, repeat prednisone  asd

## 2023-11-01 DIAGNOSIS — M795 Residual foreign body in soft tissue: Secondary | ICD-10-CM | POA: Diagnosis not present

## 2023-11-01 DIAGNOSIS — N5201 Erectile dysfunction due to arterial insufficiency: Secondary | ICD-10-CM | POA: Diagnosis not present

## 2023-11-01 DIAGNOSIS — N401 Enlarged prostate with lower urinary tract symptoms: Secondary | ICD-10-CM | POA: Diagnosis not present

## 2023-11-01 DIAGNOSIS — L258 Unspecified contact dermatitis due to other agents: Secondary | ICD-10-CM | POA: Diagnosis not present

## 2023-11-01 DIAGNOSIS — X32XXXD Exposure to sunlight, subsequent encounter: Secondary | ICD-10-CM | POA: Diagnosis not present

## 2023-11-01 DIAGNOSIS — L570 Actinic keratosis: Secondary | ICD-10-CM | POA: Diagnosis not present

## 2023-11-01 DIAGNOSIS — B078 Other viral warts: Secondary | ICD-10-CM | POA: Diagnosis not present

## 2023-11-01 DIAGNOSIS — R351 Nocturia: Secondary | ICD-10-CM | POA: Diagnosis not present

## 2023-11-15 DIAGNOSIS — L570 Actinic keratosis: Secondary | ICD-10-CM | POA: Diagnosis not present

## 2023-11-15 DIAGNOSIS — X32XXXD Exposure to sunlight, subsequent encounter: Secondary | ICD-10-CM | POA: Diagnosis not present

## 2023-11-20 ENCOUNTER — Telehealth: Payer: Self-pay | Admitting: Internal Medicine

## 2023-11-20 DIAGNOSIS — Z9849 Cataract extraction status, unspecified eye: Secondary | ICD-10-CM | POA: Diagnosis not present

## 2023-11-20 DIAGNOSIS — D6869 Other thrombophilia: Secondary | ICD-10-CM | POA: Diagnosis not present

## 2023-11-20 NOTE — Telephone Encounter (Signed)
 Copied from CRM [id number]. Topic: Clinical - Medication Question >> Nov 20, 2023  2:59 PM Richard Cannon wrote: Reason for CRM: Patient called in stating that his insurance will no longer cover  apixaban  (ELIQUIS ) 5 MG TABS tablet ,and would like to know if he could be prescribed coumadin instead?    [facility] [address] [place] - [address]  Phone: [phone] Fax: [phone]

## 2023-11-21 NOTE — Telephone Encounter (Signed)
 Due for physical please schedule for anytime and we should discuss then

## 2023-11-22 NOTE — Telephone Encounter (Signed)
 Patient return call to office to inquiry about the status of medication change request.. patient informed of provider notations. Next available appt is 12/05/23 which patient is unavailable as he will be out of town. Patient would like to know what he is suppose to do in the meantime for medication while awaiting appointment with provider. Per CAL patient will received a call back to further discuss.

## 2023-11-22 NOTE — Telephone Encounter (Signed)
**Note De-identified  [place] Obfuscation** Please advise 

## 2023-11-23 ENCOUNTER — Other Ambulatory Visit: Payer: Self-pay | Admitting: Internal Medicine

## 2023-11-24 ENCOUNTER — Encounter: Payer: Self-pay | Admitting: Internal Medicine

## 2023-11-27 NOTE — Telephone Encounter (Signed)
 I would not recommend change without discussion

## 2023-11-29 ENCOUNTER — Telehealth: Payer: Self-pay | Admitting: Internal Medicine

## 2023-11-29 NOTE — Telephone Encounter (Signed)
 Appointment has already been made and my-chart message documentation in chart.

## 2023-11-29 NOTE — Telephone Encounter (Signed)
 Richard Cannon has sent patient a my chart message to schedule a visit

## 2023-11-29 NOTE — Telephone Encounter (Signed)
 Copied from CRM [id number]. Topic: Clinical - Request for Lab/Test Order >> Nov 29, 2023  9:57 AM Macario HERO wrote: Reason for CRM: Patient requesting same day labs during physical on 12/25/23.

## 2023-11-30 DIAGNOSIS — K080 Exfoliation of teeth due to systemic causes: Secondary | ICD-10-CM | POA: Diagnosis not present

## 2023-12-21 ENCOUNTER — Encounter (INDEPENDENT_AMBULATORY_CARE_PROVIDER_SITE_OTHER): Payer: Self-pay | Admitting: Otolaryngology

## 2023-12-21 ENCOUNTER — Ambulatory Visit (INDEPENDENT_AMBULATORY_CARE_PROVIDER_SITE_OTHER): Payer: Medicare Other | Admitting: Otolaryngology

## 2023-12-21 VITALS — BP 155/83 | HR 92

## 2023-12-21 DIAGNOSIS — H903 Sensorineural hearing loss, bilateral: Secondary | ICD-10-CM | POA: Diagnosis not present

## 2023-12-21 DIAGNOSIS — H6123 Impacted cerumen, bilateral: Secondary | ICD-10-CM | POA: Diagnosis not present

## 2023-12-21 DIAGNOSIS — H6983 Other specified disorders of Eustachian tube, bilateral: Secondary | ICD-10-CM | POA: Insufficient documentation

## 2023-12-21 DIAGNOSIS — H6980 Other specified disorders of Eustachian tube, unspecified ear: Secondary | ICD-10-CM | POA: Diagnosis not present

## 2023-12-21 NOTE — Progress Notes (Signed)
 Patient ID: Richard Cannon, male   DOB: 1935-04-09, 89 y.o.   MRN: [id number]  Follow-up: Bilateral hearing loss, eustachian tube dysfunction  HPI: The patient is an 89 year old male who returns today for his follow-up evaluation.  He was last seen 1 year ago.  At that time, he was noted to have bilateral high-frequency sensorineural hearing loss.  He was also diagnosed with eustachian tube dysfunction.  He was treated with Flonase and Valsalva exercise.  The patient returns today reporting no significant change in his hearing.  He wears his hearing aids daily.  His eustachian tube dysfunction symptoms are currently under control.  He only uses Flonase as needed.  He denies any otalgia, otorrhea, or vertigo.  Exam: General: Communicates without difficulty, well nourished, no acute distress. Head: Normocephalic, no evidence injury, no tenderness, facial buttresses intact without stepoff. Face/sinus: No tenderness to palpation and percussion. Facial movement is normal and symmetric. Eyes: PERRL, EOMI. No scleral icterus, conjunctivae clear. Neuro: CN II exam reveals vision grossly intact.  No nystagmus at any point of gaze. Ears: Auricles well formed without lesions.  Bilateral cerumen impaction.  Nose: External evaluation reveals normal support and skin without lesions.  Dorsum is intact.  Anterior rhinoscopy reveals congested mucosa over anterior aspect of inferior turbinates and intact septum.  No purulence noted. Oral:  Oral cavity and oropharynx are intact, symmetric, without erythema or edema.  Mucosa is moist without lesions. Neck: Full range of motion without pain.  There is no significant lymphadenopathy.  No masses palpable.  Thyroid  bed within normal limits to palpation.  Parotid glands and submandibular glands equal bilaterally without mass.  Trachea is midline. Neuro:  CN 2-12 grossly intact.   Procedure: Bilateral cerumen disimpaction Anesthesia: None Description: Under the operating  microscope, the cerumen is carefully removed with a combination of cerumen currette, alligator forceps, and suction catheters.  After the cerumen is removed, the TMs are noted to be normal.  No mass, erythema, or lesions. The patient tolerated the procedure well.    Assessment: 1.  Subjectively stable bilateral high-frequency sensorineural hearing loss, likely secondary to routine presbycusis. 2.  Bilateral cerumen impaction.  After the cerumen disimpaction procedure, his ear canals, tympanic membranes, and middle ear spaces are all normal. 3.  His eustachian tube dysfunction is currently under control.  Plan: 1.  The physical exam findings are reviewed with the patient. 2.  Otomicroscopy with bilateral cerumen disimpaction. 3.  Continue the use of his hearing aids. 4.  Flonase nasal spray as needed. 5.  The patient will return for reevaluation in 1 year.

## 2023-12-25 ENCOUNTER — Encounter: Payer: Self-pay | Admitting: Internal Medicine

## 2023-12-25 ENCOUNTER — Ambulatory Visit (INDEPENDENT_AMBULATORY_CARE_PROVIDER_SITE_OTHER): Admitting: Internal Medicine

## 2023-12-25 ENCOUNTER — Ambulatory Visit: Payer: Self-pay | Admitting: Internal Medicine

## 2023-12-25 VITALS — BP 144/82 | HR 72 | Temp 98.1°F | Ht 70.0 in | Wt 202.0 lb

## 2023-12-25 DIAGNOSIS — Z0000 Encounter for general adult medical examination without abnormal findings: Secondary | ICD-10-CM

## 2023-12-25 DIAGNOSIS — I5032 Chronic diastolic (congestive) heart failure: Secondary | ICD-10-CM

## 2023-12-25 DIAGNOSIS — Z86711 Personal history of pulmonary embolism: Secondary | ICD-10-CM

## 2023-12-25 DIAGNOSIS — R350 Frequency of micturition: Secondary | ICD-10-CM

## 2023-12-25 DIAGNOSIS — I10 Essential (primary) hypertension: Secondary | ICD-10-CM | POA: Diagnosis not present

## 2023-12-25 DIAGNOSIS — N401 Enlarged prostate with lower urinary tract symptoms: Secondary | ICD-10-CM

## 2023-12-25 LAB — COMPREHENSIVE METABOLIC PANEL WITH GFR
ALT: 18 U/L (ref 0–53)
AST: 17 U/L (ref 0–37)
Albumin: 3.8 g/dL (ref 3.5–5.2)
Alkaline Phosphatase: 58 U/L (ref 39–117)
BUN: 13 mg/dL (ref 6–23)
CO2: 25 meq/L (ref 19–32)
Calcium: 8.7 mg/dL (ref 8.4–10.5)
Chloride: 106 meq/L (ref 96–112)
Creatinine, Ser: 0.79 mg/dL (ref 0.40–1.50)
GFR: 80.25 mL/min (ref >60.00)
Glucose, Bld: 126 mg/dL — ABNORMAL HIGH (ref 70–99)
Potassium: 3.8 meq/L (ref 3.5–5.1)
Sodium: 138 meq/L (ref 135–145)
Total Bilirubin: 0.6 mg/dL (ref 0.2–1.2)
Total Protein: 6.2 g/dL (ref 6.0–8.3)

## 2023-12-25 LAB — CBC
HCT: 45.1 % (ref 39.0–52.0)
Hemoglobin: 15.3 g/dL (ref 13.0–17.0)
MCHC: 34 g/dL (ref 30.0–36.0)
MCV: 98.2 fl (ref 78.0–100.0)
Platelets: 239 K/uL (ref 150.0–400.0)
RBC: 4.59 Mil/uL (ref 4.22–5.81)
RDW: 13.6 % (ref 11.5–15.5)
WBC: 4.8 K/uL (ref 4.0–10.5)

## 2023-12-25 LAB — LIPID PANEL
Cholesterol: 198 mg/dL (ref 0–200)
HDL: 34 mg/dL — ABNORMAL LOW (ref >39.00)
LDL Cholesterol: 117 mg/dL — ABNORMAL HIGH (ref 0–99)
NonHDL: 164.19
Total CHOL/HDL Ratio: 6
Triglycerides: 238 mg/dL — ABNORMAL HIGH (ref 0.0–149.0)
VLDL: 47.6 mg/dL — ABNORMAL HIGH (ref 0.0–40.0)

## 2023-12-25 MED ORDER — HYDROXYZINE HCL 10 MG PO TABS: 10.0000 mg | ORAL_TABLET | Freq: Three times a day (TID) | ORAL | 1 refills | Status: AC | PRN

## 2023-12-25 MED ORDER — SILDENAFIL CITRATE 100 MG PO TABS: 50.0000 mg | ORAL_TABLET | Freq: Every day | ORAL | 11 refills | Status: AC | PRN

## 2023-12-25 MED ORDER — APIXABAN 5 MG PO TABS: 5.0000 mg | ORAL_TABLET | Freq: Two times a day (BID) | ORAL | 3 refills | Status: AC

## 2023-12-25 NOTE — Patient Instructions (Signed)
  Richard Cannon , Thank you for taking time to come for your Medicare Wellness Visit. I appreciate your ongoing commitment to your health goals. Please review the following plan we discussed and let me know if I can assist you in the future.   These are the goals we discussed:  Goals      Patient Stated     Maintain current health status. 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     Patient Stated     Increase the amount of time individuals come to assist me in my home to twice a week to give me more me time to relax and enjoy my hobbies.         This is a list of the screening recommended for you and due dates:  Health Maintenance  Topic Date Due   Zoster (Shingles) Vaccine (2 of 2) 08/02/2018   COVID-19 Vaccine (3 - 2024-25 season) 01/22/2023   Flu Shot  12/22/2023   Medicare Annual Wellness Visit  09/10/2024   DTaP/Tdap/Td vaccine (3 - Td or Tdap) 06/12/2028   Pneumococcal Vaccine for age over 45  Completed   Hepatitis B Vaccine  Aged Out   HPV Vaccine  Aged Out   Meningitis B Vaccine  Aged Out

## 2023-12-25 NOTE — Assessment & Plan Note (Addendum)
Flu shot yearly. Pneumonia complete. Shingrix complete. Tetanus due 2030. Colonoscopy aged out. Counseled about sun safety and mole surveillance. Counseled about the dangers of distracted driving. Given 10 year screening recommendations.

## 2023-12-25 NOTE — Progress Notes (Signed)
 Subjective:   Patient ID: Richard Cannon, male    DOB: 1934/07/22, 89 y.o.   MRN: [id number]  The patient is here for medicare wellness/physical. Pertinent topics discussed below: Discussed the use of AI scribe software for clinical note transcription with the patient, who gave verbal consent to proceed.  History of Present Illness He is currently on Eliquis  for anticoagulation and reports no side effects such as dizziness. He mentions being in the 'donut hole' with his medication coverage but expects to be out of it after a few more prescriptions.  Here for medicare wellness/physical, no new complaints. Please see A/P for status and treatment of chronic medical problems.   Diet: heart healthy Physical activity: active Depression/mood screen: negative Hearing: mild to moderate loss, no change using aids Visual acuity: grossly normal, performs annual eye exam  ADLs: capable Fall risk: none Home safety: good Cognitive evaluation: intact to orientation, naming, recall and repetition EOL planning: adv directives discussed, in place  [employer] Visit from 10/09/2023 in [facility] [place]  PHQ-2 Total Score 0    Flowsheet Row Office Visit from 10/09/2023 in [facility] [place]  PHQ-9 Total Score 0      09/05/2022    2:40 PM 03/22/2023    3:50 PM 09/11/2023    3:19 PM 09/18/2023   11:02 AM 10/09/2023    3:56 PM  Fall Risk  Falls in the past year? 0 0 0 0 0  Was there an injury with Fall? 0 0 0  0  Fall Risk Category Calculator 0 0 0  0  Patient at Risk for Falls Due to  No Fall Risks No Fall Risks  No Fall Risks  Fall risk Follow up Falls evaluation completed Falls evaluation completed Falls prevention discussed;Falls evaluation completed Falls evaluation completed Falls evaluation completed    I have personally reviewed and have noted 1. The patient's medical and social history - reviewed today no changes 2. Their  use of alcohol, tobacco or illicit drugs 3. Their current medications and supplements 4. The patient's functional ability including ADL's, fall risks, home safety risks and hearing or visual impairment. 5. Diet and physical activities 6. Evidence for depression or mood disorders 7. Care team reviewed and updated 8.  The patient is not on an opioid pain medication.  Patient Care Team: Rollene Almarie LABOR, MD as PCP - General (Internal Medicine) Charmayne Molly, MD as Consulting Physician (Ophthalmology) Past Medical History:  Diagnosis Date   Acute pulmonary embolism [facility])    Benign prostatic hypertrophy    Deviated septum    DVT (deep venous thrombosis) (HCC)    Pneumonia of lower lobe of lung    bilateral   Respiratory failure with hypoxia [facility])    Past Surgical History:  Procedure Laterality Date   EYE SURGERY     hernia surgery  05/30/2014   SEPTOPLASTY N/A 10/05/2015   Procedure: SEPTOPLASTY;  Surgeon: Daniel Moccasin, MD;  Location: [facility];  Service: ENT;  Laterality: N/A;   TURBINATE REDUCTION Bilateral 10/05/2015   Procedure: BILATERAL TURBINATE REDUCTION;  Surgeon: Daniel Moccasin, MD;  Location: [facility];  Service: ENT;  Laterality: Bilateral;   Family History  Problem Relation Age of Onset   Heart disease Mother    Breast cancer Mother    Skin cancer Brother    Review of Systems  Constitutional: Negative.   HENT: Negative.    Eyes: Negative.  Respiratory:  Negative for cough, chest tightness and shortness of breath.   Cardiovascular:  Negative for chest pain, palpitations and leg swelling.  Gastrointestinal:  Negative for abdominal distention, abdominal pain, constipation, diarrhea, nausea and vomiting.  Musculoskeletal: Negative.   Skin: Negative.   Neurological: Negative.   Psychiatric/Behavioral: Negative.      Objective:  Physical Exam Constitutional:      Appearance: He is well-developed.  HENT:     Head: Normocephalic and  atraumatic.  Cardiovascular:     Rate and Rhythm: Normal rate and regular rhythm.  Pulmonary:     Effort: Pulmonary effort is normal. No respiratory distress.     Breath sounds: Normal breath sounds. No wheezing or rales.  Abdominal:     General: Bowel sounds are normal. There is no distension.     Palpations: Abdomen is soft.     Tenderness: There is no abdominal tenderness. There is no rebound.  Musculoskeletal:     Cervical back: Normal range of motion.  Skin:    General: Skin is warm and dry.  Neurological:     Mental Status: He is alert and oriented to person, place, and time.     Coordination: Coordination normal.     Vitals:   12/25/23 1053 12/25/23 1059  BP: (!) 144/82 (!) 144/82  Pulse: 72   Temp: 98.1 F (36.7 C)   TempSrc: Oral   SpO2: 97%   Weight: 202 lb (91.6 kg)   Height: 5' 10 (1.778 m)     Assessment & Plan:

## 2023-12-25 NOTE — Assessment & Plan Note (Signed)
 Checking CBC and CMP. Adjust as needed not on meds. BP is borderline today and normal he is checking at home.

## 2023-12-25 NOTE — Assessment & Plan Note (Signed)
 Taking proscar  5 mg daily and symptoms are manageable.

## 2023-12-25 NOTE — Assessment & Plan Note (Signed)
 No flare today including no SOB on exertion or swelling. Not on meds. Monitor closely.

## 2023-12-25 NOTE — Assessment & Plan Note (Signed)
 Taking eliquis  5 mg BID and refilled. Checking CBC and CMP. We discussed use of warfarin if desired. Eliquis  is quite costly and he will keep for now as he is past cost for the year.

## 2023-12-26 ENCOUNTER — Other Ambulatory Visit (HOSPITAL_COMMUNITY): Payer: Self-pay

## 2023-12-27 ENCOUNTER — Other Ambulatory Visit (HOSPITAL_COMMUNITY): Payer: Self-pay

## 2024-01-04 ENCOUNTER — Telehealth: Payer: Self-pay

## 2024-01-04 ENCOUNTER — Other Ambulatory Visit (HOSPITAL_COMMUNITY): Payer: Self-pay

## 2024-01-04 NOTE — Telephone Encounter (Signed)
 I called patient and informed him of this and he wanted to know why this pecribed to him and what does he need to be taking this for?

## 2024-01-04 NOTE — Telephone Encounter (Signed)
 Copied from CRM #[id number]. Topic: Clinical - Medication Prior Auth >> Jan 04, 2024  4:09 PM Richard Cannon wrote: Reason for CRM: Damien wants to inform clinic that Prior Authorization has been approved for hydrOXYzine  (ATARAX ) 10 MG tablet.

## 2024-01-04 NOTE — Telephone Encounter (Signed)
 Pharmacy Patient Advocate Encounter   Received notification from CoverMyMeds that prior authorization for hydrOXYzine  HCl 10MG  tablets is required/requested.   Insurance verification completed.   The patient is insured through [facility] .   Per test claim: PA required; PA submitted to above mentioned insurance via Latent Key/confirmation #/EOC [id number] Status is pending

## 2024-01-04 NOTE — Telephone Encounter (Signed)
 Pharmacy Patient Advocate Encounter  Received notification from [facility] that Prior Authorization for hydrOXYzine  HCl 10MG  tablets has been APPROVED from 01/04/24 to 01/03/25. Ran test claim, Copay is $2.80. This test claim was processed through [facility]- copay amounts may vary at other pharmacies due to pharmacy/plan contracts, or as the patient moves through the different stages of their insurance plan.   PA #/Case ID/Reference #: [id number]

## 2024-01-05 NOTE — Telephone Encounter (Signed)
 Called patient back and explained to him that he can use this as needed

## 2024-01-05 NOTE — Telephone Encounter (Signed)
 I believe it was prescribed as needed for him for itching some time ago and was refilled at physical so only if needed.

## 2024-03-25 DIAGNOSIS — K080 Exfoliation of teeth due to systemic causes: Secondary | ICD-10-CM | POA: Diagnosis not present

## 2024-05-08 DIAGNOSIS — L308 Other specified dermatitis: Secondary | ICD-10-CM | POA: Diagnosis not present

## 2024-09-11 ENCOUNTER — Ambulatory Visit

## 2024-09-12 ENCOUNTER — Ambulatory Visit
